# Patient Record
Sex: Female | Born: 1950 | Race: White | Hispanic: No | Marital: Married | Smoking: Never smoker
Health system: Southern US, Community
[De-identification: ages and names within clinical notes are randomized; demographics above are authoritative.]

## PROBLEM LIST (undated history)

## (undated) DIAGNOSIS — F32A Depression, unspecified: Secondary | ICD-10-CM

## (undated) DIAGNOSIS — G459 Transient cerebral ischemic attack, unspecified: Secondary | ICD-10-CM

## (undated) DIAGNOSIS — E669 Obesity, unspecified: Secondary | ICD-10-CM

## (undated) DIAGNOSIS — R7611 Nonspecific reaction to tuberculin skin test without active tuberculosis: Secondary | ICD-10-CM

## (undated) DIAGNOSIS — F329 Major depressive disorder, single episode, unspecified: Secondary | ICD-10-CM

## (undated) HISTORY — PX: OTHER SURGICAL HISTORY: SHX169

## (undated) HISTORY — DX: Nonspecific reaction to tuberculin skin test without active tuberculosis: R76.11

## (undated) HISTORY — DX: Major depressive disorder, single episode, unspecified: F32.9

## (undated) HISTORY — DX: Obesity, unspecified: E66.9

## (undated) HISTORY — DX: Depression, unspecified: F32.A

## (undated) HISTORY — DX: Transient cerebral ischemic attack, unspecified: G45.9

---

## 2009-02-02 ENCOUNTER — Ambulatory Visit: Payer: Self-pay | Admitting: Family Medicine

## 2009-02-02 DIAGNOSIS — G459 Transient cerebral ischemic attack, unspecified: Secondary | ICD-10-CM | POA: Insufficient documentation

## 2009-02-02 DIAGNOSIS — E039 Hypothyroidism, unspecified: Secondary | ICD-10-CM | POA: Insufficient documentation

## 2009-02-02 DIAGNOSIS — I10 Essential (primary) hypertension: Secondary | ICD-10-CM | POA: Insufficient documentation

## 2009-02-27 DIAGNOSIS — M7120 Synovial cyst of popliteal space [Baker], unspecified knee: Secondary | ICD-10-CM

## 2009-02-27 DIAGNOSIS — K3189 Other diseases of stomach and duodenum: Secondary | ICD-10-CM

## 2009-02-27 DIAGNOSIS — R1013 Epigastric pain: Secondary | ICD-10-CM

## 2009-03-06 ENCOUNTER — Encounter: Payer: Self-pay | Admitting: Family Medicine

## 2009-03-06 LAB — CONVERTED CEMR LAB
ALT: 29 units/L (ref 0–35)
AST: 19 units/L (ref 0–37)
Alkaline Phosphatase: 118 units/L — ABNORMAL HIGH (ref 39–117)
Calcium: 9.5 mg/dL (ref 8.4–10.5)
Creatinine, Ser: 0.63 mg/dL (ref 0.40–1.20)
Sodium: 139 meq/L (ref 135–145)

## 2009-03-12 ENCOUNTER — Ambulatory Visit: Payer: Self-pay | Admitting: Family Medicine

## 2009-03-12 DIAGNOSIS — D239 Other benign neoplasm of skin, unspecified: Secondary | ICD-10-CM | POA: Insufficient documentation

## 2009-03-13 ENCOUNTER — Encounter: Payer: Self-pay | Admitting: Family Medicine

## 2009-03-13 LAB — CONVERTED CEMR LAB
HDL: 33 mg/dL — ABNORMAL LOW (ref >39)
LDL Cholesterol: 166 mg/dL — ABNORMAL HIGH (ref 0–99)
Total CHOL/HDL Ratio: 7.5
VLDL: 47 mg/dL — ABNORMAL HIGH (ref 0–40)

## 2009-05-14 ENCOUNTER — Ambulatory Visit: Payer: Self-pay | Admitting: Family Medicine

## 2009-05-14 DIAGNOSIS — T7840XA Allergy, unspecified, initial encounter: Secondary | ICD-10-CM | POA: Insufficient documentation

## 2009-05-14 DIAGNOSIS — E1165 Type 2 diabetes mellitus with hyperglycemia: Secondary | ICD-10-CM

## 2009-05-14 LAB — CONVERTED CEMR LAB
Blood Glucose, Fasting: 227 mg/dL
Hgb A1c MFr Bld: 10 %

## 2009-05-17 ENCOUNTER — Telehealth: Payer: Self-pay | Admitting: Family Medicine

## 2009-06-14 ENCOUNTER — Ambulatory Visit: Payer: Self-pay | Admitting: Family Medicine

## 2009-07-09 ENCOUNTER — Ambulatory Visit: Payer: Self-pay | Admitting: Family Medicine

## 2009-07-09 LAB — CONVERTED CEMR LAB

## 2009-08-27 ENCOUNTER — Ambulatory Visit: Payer: Self-pay | Admitting: Family Medicine

## 2009-08-27 LAB — CONVERTED CEMR LAB: Hgb A1c MFr Bld: 7.2 %

## 2009-08-27 LAB — HM DIABETES FOOT EXAM

## 2009-11-26 ENCOUNTER — Ambulatory Visit: Payer: Self-pay | Admitting: Family Medicine

## 2010-01-17 ENCOUNTER — Ambulatory Visit: Payer: Self-pay | Admitting: Family Medicine

## 2010-01-17 DIAGNOSIS — R209 Unspecified disturbances of skin sensation: Secondary | ICD-10-CM | POA: Insufficient documentation

## 2010-01-17 LAB — CONVERTED CEMR LAB: Hgb A1c MFr Bld: 7.3 %

## 2010-01-18 ENCOUNTER — Encounter: Admission: RE | Admit: 2010-01-18 | Discharge: 2010-01-18 | Payer: Self-pay | Admitting: Family Medicine

## 2010-01-21 ENCOUNTER — Telehealth: Payer: Self-pay | Admitting: Family Medicine

## 2010-04-09 ENCOUNTER — Ambulatory Visit: Payer: Self-pay | Admitting: Family Medicine

## 2010-04-23 ENCOUNTER — Encounter: Payer: Self-pay | Admitting: Family Medicine

## 2010-07-22 ENCOUNTER — Telehealth: Payer: Self-pay | Admitting: Family Medicine

## 2010-08-20 ENCOUNTER — Encounter: Payer: Self-pay | Admitting: Family Medicine

## 2010-08-20 LAB — HM DIABETES EYE EXAM: HM Diabetic Eye Exam: NORMAL

## 2010-08-26 ENCOUNTER — Encounter: Payer: Self-pay | Admitting: Family Medicine

## 2010-09-05 ENCOUNTER — Ambulatory Visit: Payer: Self-pay | Admitting: Family Medicine

## 2010-09-05 DIAGNOSIS — M545 Low back pain: Secondary | ICD-10-CM

## 2010-09-05 LAB — CONVERTED CEMR LAB

## 2010-10-07 ENCOUNTER — Telehealth: Payer: Self-pay | Admitting: Family Medicine

## 2010-12-24 NOTE — Assessment & Plan Note (Signed)
Summary: FU DM, right arm numbness   Vital Signs:  Patient profile:   60 year old female Weight:      216.04 pounds Temp:     96.7 degrees F oral Pulse rate:   74 / minute BP sitting:   123 / 86 Cuff size:   regular  Vitals Entered By: Kern Reap CMA Duncan Dull) (January 17, 2010 3:57 PM) CC: follow-up visit Is Patient Diabetic? Yes Did you bring your meter with you today? No   Primary Care Provider:  Nani Gasser MD  CC:  follow-up visit.  History of Present Illness: Right arm stays numb.  Sometimes if moves her neck feels like a shooting sensation down her arm.  Numbness is down to her fingertips.  No neck pain.  Wonder if it her matress. Worse if bends forward.   Discomfort is constant but does vary in intensity. Feels it originates in the Upper trap area and literaly goes to her finger tips. Feels if rubs the muscles over the upper trap that is provides some relief.  No weakness of the arm. No neck or shoulder trauma.  Has been using motrin as needed - helps some.    Diabetes Management History:      The patient is a 60 years old female who comes in for evaluation of Type 2 Diabetes Mellitus.  She states understanding of dietary principles and is following her diet appropriately.  She is checking home blood sugars.  She says that she is not exercising regularly.        Hypoglycemic symptoms are not occurring.  No hyperglycemic symptoms are reported.  Other comments include: Has been running over 200. Running 172 this Am. .        There are no symptoms to suggest diabetic complications.  Since her last visit, no infections have occurred.  No changes have been made to her treatment plan since last visit.    Allergies: 1)  ! Ampicillin 2)  ! Pcn 3)  ! Nitroglycerin 4)  ! Fluvirin (Influenza Vac Typ A&b Surf Ant)  Past History:  Past Surgical History: Right carpel tunnel surgery.   Social History: Reviewed history from 08/27/2009 and no changes required. 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  Married to [employer].  Never Smoked Alcohol use-no Drug use-no Regular exercise-no  Physical Exam  General:  Well-developed,well-nourished,in no acute distress; alert,appropriate and cooperative throughout examination Lungs:  Normal respiratory effort, chest expands symmetrically. Lungs are clear to auscultation, no crackles or wheezes. Heart:  Normal rate and regular rhythm. S1 and S2 normal without gallop, murmur, click, rub or other extra sounds. Msk:  Neck with NROM but has pain with side bending to the right greater than the left.  Pain with rotation right as well. Shoulders wtih NROM  SHoulder, elbow, and wrist strength is 5/5.  All fingers have 5/5 strength in the right hand.   Skin:  WEll healed surgical scar on her right wrist.   Psych:  Cognition and judgment appear intact. Alert and cooperative with normal attention span and concentration. No apparent delusions, illusions, hallucinations   Impression & Recommendations:  Problem # 1:  DIABETES MELLITUS, UNCONTROLLED (ICD-250.02) Assessment Deteriorated Reminded him to get her eye exam.  Her A1C is above goal.Will stop her gliucotrol and change to onglyza once a day.  Samples given for one week adn coupon card given.  Rx sent ot pharm. F/u in 6 weeks. Asked her to bring in her sugar log.  Her updated medication list for this problem includes:    Lisinopril-hydrochlorothiazide 20-25 Mg Tabs (Lisinopril-hydrochlorothiazide) .Marland Kitchen... Take 1 tablet by mouth once a day    Metformin Hcl 1000 Mg Tabs (Metformin hcl) .Marland Kitchen... Take 1 tablet by mouth two times a day    Onglyza 5 Mg Tabs (Saxagliptin hcl) .Marland Kitchen... Take 1 tablet by mouth once a day in the am.  Orders: Fingerstick (36416) Hgb A1C (16109UE)  Problem # 2:  ARM NUMBNESS (ICD-782.0) Assessment: New Discussed  that I think this may be coming from her neck. Possible a disck issue. It sounds like a nerve is being impinged at some point from  her neck down.  WIll get an xray of her neck and in the meantime will have her try a muscle relaxer since she also has some pain and tenderness over the upper trapezius on the right and see if this provides any relief.  I am concnered that her sxs persist throughout the day.   Orders: T-DG Cervical Spine 2-3 Views (45409)  Complete Medication List: 1)  Levothroid 75 Mcg Tabs (Levothyroxine sodium) .... Take 1 tablet by mouth once a day 2)  Lisinopril-hydrochlorothiazide 20-25 Mg Tabs (Lisinopril-hydrochlorothiazide) .... Take 1 tablet by mouth once a day 3)  Simvastatin 40 Mg Tabs (Simvastatin) .... Take 1 tablet by mouth once a day at bedtime 4)  Glucometer  .... Strips and lancets for new dx dm 250.02. to check up to 2 x a day 5)  Metformin Hcl 1000 Mg Tabs (Metformin hcl) .... Take 1 tablet by mouth two times a day 6)  Toprol Xl 25 Mg Xr24h-tab (Metoprolol succinate) .... Take 1 tablet by mouth once a day 7)  Onglyza 5 Mg Tabs (Saxagliptin hcl) .... Take 1 tablet by mouth once a day in the am. 8)  Flexeril 10 Mg Tabs (Cyclobenzaprine hcl) .... Take 1 tablet by mouth once a day at bedtime  as needed muscle spasm  Patient Instructions: 1)  Stop the glucotrol and start the onlyza. It is once a day.  2)  Please schedule a follow-up appointment in 6 weeks with your blood sugar.  Prescriptions: FLEXERIL 10 MG TABS (CYCLOBENZAPRINE HCL) Take 1 tablet by mouth once a day at bedtime  as needed muscle spasm  #20 x 0   Entered and Authorized by:   Nani Gasser MD   Signed by:   Nani Gasser MD on 01/17/2010   Method used:   Electronically to        [facility] [phone]* (retail)       [address] [place], [place]  [address]       Ph: [phone]       Fax: [phone]   RxID:   [id number] ONGLYZA 5 MG TABS (SAXAGLIPTIN HCL) Take 1 tablet by mouth once a day in the AM.  #30 x 2   Entered and Authorized by:   Nani Gasser MD   Signed by:   Nani Gasser MD on  01/17/2010   Method used:   Electronically to        [facility] [phone]* (retail)       [address] [place], [place]  [address]       Ph: [phone]       Fax: [phone]   RxID:   [id number]   Laboratory Results   Blood Tests     HGBA1C: 7.3%   (  Normal Range: Non-Diabetic - 3-6%   Control Diabetic - 6-8%)

## 2010-12-24 NOTE — Progress Notes (Signed)
Summary: refill-- flexpen  Phone Note Refill Request Message from:  Patient on October 07, 2010 5:24 PM  Refills Requested: Medication #1:  Levemir flex pen  10 units at bedtime. Pt states they received sample from [place]. She takes 10 units at bedtime. Needs refill sent to pharmacy.  Please advise.   Next Appointment Scheduled: 12-09-10 Dr Linford Arnold Initial call taken by: Mervin Kung CMA Duncan Dull),  October 07, 2010 5:26 PM  Follow-up for Phone Call        Tell her she can pick up a sample since doesn't have insruance. Double check that it is not lantus instead of Levemir.   Follow-up by: Nani Gasser MD,  October 08, 2010 7:49 AM  Additional Follow-up for Phone Call Additional follow up Details #1::        2 boxes of samples put up for pt to come and pick up Additional Follow-up by: Kathlene November LPN,  October 08, 2010 2:26 PM

## 2010-12-24 NOTE — Assessment & Plan Note (Addendum)
Summary: 4 mo. f/u  DM   Vital Signs:  Patient profile:   60 year old female Height:      67 inches Weight:      214 pounds Pulse rate:   81 / minute BP sitting:   123 / 87  (left arm) Cuff size:   regular  Vitals Entered By: Avon Gully CMA, Duncan Dull) (September 05, 2010 3:43 PM) CC: f/u diabetes   Primary Care Provider:  Nani Gasser MD  CC:  f/u diabetes.  History of Present Illness: Low back pain on both sides of her back for sevearl months. Feels stiff when she stands up. Worse when she bends over. Occ take OTC pain reliver.  No trauma.  Has had problems with her back n this area before adn says took 4 diffferent meds for it.   Diabetes Management History:      The patient is a 60 years old female who comes in for evaluation of Type 2 Diabetes Mellitus.  She states understanding of dietary principles and is following her diet appropriately.  Self foot exams are being performed.  She is checking home blood sugars.  She says that she is not exercising regularly.        Hypoglycemic symptoms are not occurring.  No hyperglycemic symptoms are reported.  Other comments include: Sugars 140-200.        There are no symptoms to suggest diabetic complications.  Since her last visit, no infections have occurred.  No changes have been made to her treatment plan since last visit.    Habits & Providers  Exercise-Depression-Behavior     Times/week: 5  Current Medications (verified): 1)  Levothroid 75 Mcg Tabs (Levothyroxine Sodium) .... Take 1 Tablet By Mouth Once A Day 2)  Lisinopril-Hydrochlorothiazide 20-25 Mg Tabs (Lisinopril-Hydrochlorothiazide) .... Take 1 Tablet By Mouth Once A Day 3)  Simvastatin 40 Mg Tabs (Simvastatin) .... Take 1 Tablet By Mouth Once A Day At Bedtime 4)  Glucometer .... Strips and Lancets For New Dx Dm 250.02. To Check Up To 2 X A Day 5)  Metformin Hcl 1000 Mg Tabs (Metformin Hcl) .... Take 1 Tablet By Mouth Two Times A Day 6)  Toprol Xl 25 Mg Xr24h-Tab  (Metoprolol Succinate) .... Take 1 Tablet By Mouth Once A Day 7)  Glipizide 10 Mg Xr24h-Tab (Glipizide) .... Take 1 Tablet By Mouth Once A Day 8)  Aspirin 81 Mg Tbec (Aspirin) 9)  Prilosec Otc 20 Mg Tbec (Omeprazole Magnesium) .... Take 1 Tab By Mouth Once Daily  Allergies (verified): 1)  ! Ampicillin 2)  ! Pcn 3)  ! Nitroglycerin 4)  ! Fluvirin (Influenza Vac Typ A&b Surf Ant)  Comments:  Nurse/Medical Assistant: The patient's medications and allergies were reviewed with the patient and were updated in the Medication and Allergy Lists. Avon Gully CMA, Duncan Dull) (September 05, 2010 3:44 PM)  Physical Exam  General:  Well-developed,well-nourished,in no acute distress; alert,appropriate and cooperative throughout examination Msk:  NOrmal flexion, dec extension. Normal rotation right and left and side bending. Neg straigh let raise. Hip, knee, and ankle strength 5/5 bilat.  Neurologic:  Patellar 2+ bilat.    Impression & Recommendations:  Problem # 1:  DIABETES MELLITUS, UNCONTROLLED (ICD-250.02) Not at goal. Discused the need for weight loss. Also given samples of Lantus to start using 10 units at bedtime .  She doesn' have insurance and will not be able to afford this but can provide samples until her f/u and maybe by then can  work on losing some weight.  Micro is + but she is on an ACE.  Labs Reviewed: Creat: 0.63 (03/06/2009)   Microalbumin: 80 (09/05/2010)  Last Eye Exam: normal (08/20/2010) Reviewed HgBA1c results: 7.8 (09/05/2010)  7.3 (01/17/2010)  Problem # 2:  BACK PAIN, LUMBAR (ICD-724.2)  Her updated medication list for this problem includes:    Aspirin 81 Mg Tbec (Aspirin)    Mobic 7.5 Mg Tabs (Meloxicam) .Marland Kitchen... Take 1 tablet by mouth two times a day as needed for back pain.  Discussed use of moist heat or ice, modified activities, medications, and stretching/strengthening exercises. Back care instructions given. To be seen in 2 weeks if no improvement; sooner if  worsening of symptoms.   Complete Medication List: 1)  Levothroid 75 Mcg Tabs (Levothyroxine sodium) .... Take 1 tablet by mouth once a day 2)  Lisinopril-hydrochlorothiazide 20-25 Mg Tabs (Lisinopril-hydrochlorothiazide) .... Take 1 tablet by mouth once a day 3)  Simvastatin 40 Mg Tabs (Simvastatin) .... Take 1 tablet by mouth once a day at bedtime 4)  Glucometer  .... Strips and lancets for new dx dm 250.02. to check up to 2 x a day 5)  Metformin Hcl 1000 Mg Tabs (Metformin hcl) .... Take 1 tablet by mouth two times a day 6)  Toprol Xl 25 Mg Xr24h-tab (Metoprolol succinate) .... Take 1 tablet by mouth once a day 7)  Glipizide 10 Mg Xr24h-tab (Glipizide) .... Take 1 tablet by mouth once a day 8)  Aspirin 81 Mg Tbec (Aspirin) 9)  Prilosec Otc 20 Mg Tbec (Omeprazole magnesium) .... Take 1 tab by mouth once daily 10)  Mobic 7.5 Mg Tabs (Meloxicam) .... Take 1 tablet by mouth two times a day as needed for back pain.  Other Orders: Fingerstick (36416) Hemoglobin A1C (83036) Creatinine  (04540) Urine Microalbumin (98119)  Patient Instructions: 1)  REally needs to work on your weight.  Work on [employer]. If you can lose about 10 lbs it will really help you sugars.  2)  Given sample of Lantus to given 10 units at beditme.  3)  Please schedule a follow-up appointment in 3 months .  Prescriptions: MOBIC 7.5 MG TABS (MELOXICAM) Take 1 tablet by mouth two times a day as needed for back pain.  #60 x 0   Entered and Authorized by:   Nani Gasser MD   Signed by:   Nani Gasser MD on 09/05/2010   Method used:   Electronically to        [facility] [phone]* (retail)       [address] [place], [place]  [address]       Ph: [phone]       Fax: [phone]   RxID:   [id number]   Laboratory Results   Urine Tests    Microalbumin (urine): 80 mg/L Creatinine: 300mg /dL  A:C Ratio 34-$[id number]$ /g  Blood Tests   Date/Time Received: 09/05/10 Date/Time Reported:  09/05/10  HGBA1C: 7.8%   (Normal Range: Non-Diabetic - 3-6%   Control Diabetic - 6-8%)        Contraindications/Deferment of Procedures/Staging:    Treatment: Flu Shot    Contraindication: allergy

## 2010-12-24 NOTE — Assessment & Plan Note (Signed)
Summary: URI   Vital Signs:  Patient profile:   60 year old female Height:      67 inches Weight:      214 pounds O2 Sat:      95 % on Room air Temp:     98.7 degrees F oral BP sitting:   115 / 84  (left arm) Cuff size:   regular  Vitals Entered By: Kathlene November (November 26, 2009 1:27 PM)  O2 Flow:  Room air CC: H/A, chills, cough, nausea, back and shoulder pain. Started last Thursday   Primary Care Provider:  Nani Gasser MD  CC:  H/A, chills, cough, nausea, and back and shoulder pain. Started last Thursday.  History of Present Illness: H/A, chills, cough, nausea, back and shoulder pain. Started last Thursday ( 5 days). Severe cougha and now her ribs are sore.  No fever. Taking Advil, Theraflu - not really helping.  Mild nasal congestions.   Current Medications (verified): 1)  Levothroid 75 Mcg Tabs (Levothyroxine Sodium) .... Take 1 Tablet By Mouth Once A Day 2)  Lisinopril-Hydrochlorothiazide 20-25 Mg Tabs (Lisinopril-Hydrochlorothiazide) .... Take 1 Tablet By Mouth Once A Day 3)  Simvastatin 40 Mg Tabs (Simvastatin) .... Take 1 Tablet By Mouth Once A Day At Bedtime 4)  Glucometer .... Strips and Lancets For New Dx Dm 250.02. To Check Up To 2 X A Day 5)  Metformin Hcl 1000 Mg Tabs (Metformin Hcl) .... Take 1 Tablet By Mouth Two Times A Day 6)  Toprol Xl 25 Mg Xr24h-Tab (Metoprolol Succinate) .... Take 1 Tablet By Mouth Once A Day 7)  Glucotrol Xl 10 Mg Xr24h-Tab (Glipizide) .... Take 1 Tablet By Mouth Once A Day  Allergies (verified): 1)  ! Ampicillin 2)  ! Pcn 3)  ! Nitroglycerin 4)  ! Fluvirin (Influenza Vac Typ A&b Surf Ant)  Comments:  Nurse/Medical Assistant: The patient's medications and allergies were reviewed with the patient and were updated in the Medication and Allergy Lists. Kathlene November (November 26, 2009 1:28 PM)  Physical Exam  General:  Well-developed,well-nourished,in no acute distress; alert,appropriate and cooperative throughout  examination Head:  Normocephalic and atraumatic without obvious abnormalities. No apparent alopecia or balding. Eyes:  No corneal or conjunctival inflammation noted. EOMI. Perrla.  Ears:  External ear exam shows no significant lesions or deformities.  Otoscopic examination reveals clear canals, tympanic membranes are intact bilaterally without bulging, retraction, inflammation or discharge. Hearing is grossly normal bilaterally. Nose:  External nasal examination shows no deformity or inflammation. Nasal mucosa are pink and moist without lesions or exudates. Mouth:  Oral mucosa and oropharynx without lesions or exudates.  Dentures.  Neck:  No deformities, masses, or tenderness noted. Lungs:  Normal respiratory effort, chest expands symmetrically. Lungs are clear to auscultation, no crackles or wheezes. Heart:  Normal rate and regular rhythm. S1 and S2 normal without gallop, murmur, click, rub or other extra sounds. Pulses:  Radial 2+  Skin:  no rashes.   Cervical Nodes:  No lymphadenopathy noted Psych:  Cognition and judgment appear intact. Alert and cooperative with normal attention span and concentration. No apparent delusions, illusions, hallucinations   Impression & Recommendations:  Problem # 1:  URI (ICD-465.9) If not better in 4-5 days then please fill rx for the ABX. Given phenergan for nausea. Will cover for sinusitis since lung exam is clear.  Her updated medication list for this problem includes:    Promethazine Hcl 25 Mg Tabs (Promethazine hcl) ..... One by mouth every  6 hours as needed nausea.  Complete Medication List: 1)  Levothroid 75 Mcg Tabs (Levothyroxine sodium) .... Take 1 tablet by mouth once a day 2)  Lisinopril-hydrochlorothiazide 20-25 Mg Tabs (Lisinopril-hydrochlorothiazide) .... Take 1 tablet by mouth once a day 3)  Simvastatin 40 Mg Tabs (Simvastatin) .... Take 1 tablet by mouth once a day at bedtime 4)  Glucometer  .... Strips and lancets for new dx dm 250.02. to  check up to 2 x a day 5)  Metformin Hcl 1000 Mg Tabs (Metformin hcl) .... Take 1 tablet by mouth two times a day 6)  Toprol Xl 25 Mg Xr24h-tab (Metoprolol succinate) .... Take 1 tablet by mouth once a day 7)  Glucotrol Xl 10 Mg Xr24h-tab (Glipizide) .... Take 1 tablet by mouth once a day 8)  Promethazine Hcl 25 Mg Tabs (Promethazine hcl) .... One by mouth every 6 hours as needed nausea. 9)  Sulfamethoxazole-trimethoprim 400-80 Mg/25ml Soln (Sulfamethoxazole-trimethoprim) .Marland Kitchen.. 10 ml by mouth two times a day for 10 days. Prescriptions: SULFAMETHOXAZOLE-TRIMETHOPRIM 400-80 MG/5ML SOLN (SULFAMETHOXAZOLE-TRIMETHOPRIM) 10 ml by mouth two times a day for 10 days.  #248ml x 0   Entered and Authorized by:   Nani Gasser MD   Signed by:   Nani Gasser MD on 11/26/2009   Method used:   Print then Give to Patient   RxID:   [id number] PROMETHAZINE HCL 25 MG TABS (PROMETHAZINE HCL) one by mouth every 6 hours as needed nausea.  #8 x 0   Entered and Authorized by:   Nani Gasser MD   Signed by:   Nani Gasser MD on 11/26/2009   Method used:   Electronically to        [facility] [phone]* (retail)       [address] [place], [place]  [address]       Ph: [phone]       Fax: [phone]   RxID:   [id number]

## 2010-12-24 NOTE — Assessment & Plan Note (Signed)
Summary: f/u DM/ thyroid   Vital Signs:  Patient profile:   60 year old female Height:      67 inches Weight:      215 pounds BMI:     33.80 O2 Sat:      98 % on Room air Pulse rate:   77 / minute BP sitting:   118 / 82  (left arm) Cuff size:   regular  Vitals Entered By: Payton Spark CMA (Apr 09, 2010 3:31 PM)  O2 Flow:  Room air CC: 6 week f/u   Primary Care Provider:  Nani Gasser MD  CC:  6 week f/u.  History of Present Illness: 60 yo WF presents for f/u visit.  She is a little early for her diabetes check up.  She admits to dieatary non adherence.  Her AM fastings are in the 150-170s.  She is also running 170s to 180s at bedtime.  She is takiing Metformin + Onglyza.  She is not exercising.  Denies lows.  Has hand tingling- seeing ortho.  Denies tingling in feet.  Denies blurry vision.  Fasting labs are due.      Current Medications (verified): 1)  Levothroid 75 Mcg Tabs (Levothyroxine Sodium) .... Take 1 Tablet By Mouth Once A Day 2)  Lisinopril-Hydrochlorothiazide 20-25 Mg Tabs (Lisinopril-Hydrochlorothiazide) .... Take 1 Tablet By Mouth Once A Day 3)  Simvastatin 40 Mg Tabs (Simvastatin) .... Take 1 Tablet By Mouth Once A Day At Bedtime 4)  Glucometer .... Strips and Lancets For New Dx Dm 250.02. To Check Up To 2 X A Day 5)  Metformin Hcl 1000 Mg Tabs (Metformin Hcl) .... Take 1 Tablet By Mouth Two Times A Day 6)  Toprol Xl 25 Mg Xr24h-Tab (Metoprolol Succinate) .... Take 1 Tablet By Mouth Once A Day 7)  Onglyza 5 Mg Tabs (Saxagliptin Hcl) .... Take 1 Tablet By Mouth Once A Day in The Am. 8)  Aspirin 81 Mg Tbec (Aspirin) 9)  Prilosec Otc 20 Mg Tbec (Omeprazole Magnesium) .... Take 1 Tab By Mouth Once Daily  Allergies (verified): 1)  ! Ampicillin 2)  ! Pcn 3)  ! Nitroglycerin 4)  ! Fluvirin (Influenza Vac Typ A&b Surf Ant)  Past History:  Past Medical History: Hx of TIA  Hx of depression.  Has been on Prozac, zoloft,  in the past.  Hx of + PPD, Neg  CXR10/2/06 T2DM obesity  Social History: Reviewed history from 08/27/2009 and no changes required. 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  Married to [employer].  Never Smoked Alcohol use-no Drug use-no Regular exercise-no  Review of Systems      See HPI  Physical Exam  General:  obese WF in NAD   Head:  /AT Eyes:  PERRLA Mouth:  pharynx pink and moist.   Neck:  no masses.   Lungs:  Normal respiratory effort, chest expands symmetrically. Lungs are clear to auscultation, no crackles or wheezes. Heart:  Normal rate and regular rhythm. S1 and S2 normal without gallop, murmur, click, rub or other extra sounds. Skin:  color normal.   Cervical Nodes:  No lymphadenopathy noted Psych:  good eye contact, not anxious appearing, and not depressed appearing.     Impression & Recommendations:  Problem # 1:  DIABETES MELLITUS, UNCONTROLLED (ICD-250.02) A1C order placed for after the 24th.  Home sugars not at goal mostly due to dietary non adherence and lack of exercise.  H/O given today on Her updated medication list for this problem  includes:    Lisinopril-hydrochlorothiazide 20-25 Mg Tabs (Lisinopril-hydrochlorothiazide) .Marland Kitchen... Take 1 tablet by mouth once a day    Metformin Hcl 1000 Mg Tabs (Metformin hcl) .Marland Kitchen... Take 1 tablet by mouth two times a day    Onglyza 5 Mg Tabs (Saxagliptin hcl) .Marland Kitchen... Take 1 tablet by mouth once a day in the am.    Aspirin 81 Mg Tbec (Aspirin)  Orders: T-Comprehensive Metabolic Panel [phone]) T-Hgb A1C [phone]) T-Lipid Profile [phone])  Labs Reviewed: Creat: 0.63 (03/06/2009)   Microalbumin: 80 (07/09/2009)  Last Eye Exam: normal (11/24/2008) Reviewed HgBA1c results: 7.3 (01/17/2010)  7.2 (08/27/2009)  Problem # 2:  HYPOTHYROIDISM (ICD-244.9) Update TSH with labs and f/u results.  Adjust thyroid medicine if indicated.   Her updated medication list for this problem includes:    Levothroid 75 Mcg Tabs (Levothyroxine sodium)  .Marland Kitchen... Take 1 tablet by mouth once a day  Orders: T-TSH ([id number])  Labs Reviewed: TSH: 3.090 (03/06/2009)    HgBA1c: 7.3 (01/17/2010) Chol: 246 (03/13/2009)   HDL: 33 (03/13/2009)   LDL: 166 (03/13/2009)   TG: 236 (03/13/2009)  Problem # 3:  HYPERTENSION, BENIGN (ICD-401.1) BP looks great.  Stay on current meds.   Her updated medication list for this problem includes:    Lisinopril-hydrochlorothiazide 20-25 Mg Tabs (Lisinopril-hydrochlorothiazide) .Marland Kitchen... Take 1 tablet by mouth once a day    Toprol Xl 25 Mg Xr24h-tab (Metoprolol succinate) .Marland Kitchen... Take 1 tablet by mouth once a day  BP today: 118/82 Prior BP: 123/86 (01/17/2010)  Prior 10 Yr Risk Heart Disease: > 32 % (06/14/2009)  Labs Reviewed: K+: 3.7 (03/06/2009) Creat: : 0.63 (03/06/2009)   Chol: 246 (03/13/2009)   HDL: 33 (03/13/2009)   LDL: 166 (03/13/2009)   TG: 236 (03/13/2009)  Complete Medication List: 1)  Levothroid 75 Mcg Tabs (Levothyroxine sodium) .... Take 1 tablet by mouth once a day 2)  Lisinopril-hydrochlorothiazide 20-25 Mg Tabs (Lisinopril-hydrochlorothiazide) .... Take 1 tablet by mouth once a day 3)  Simvastatin 40 Mg Tabs (Simvastatin) .... Take 1 tablet by mouth once a day at bedtime 4)  Glucometer  .... Strips and lancets for new dx dm 250.02. to check up to 2 x a day 5)  Metformin Hcl 1000 Mg Tabs (Metformin hcl) .... Take 1 tablet by mouth two times a day 6)  Toprol Xl 25 Mg Xr24h-tab (Metoprolol succinate) .... Take 1 tablet by mouth once a day 7)  Onglyza 5 Mg Tabs (Saxagliptin hcl) .... Take 1 tablet by mouth once a day in the am. 8)  Aspirin 81 Mg Tbec (Aspirin) 9)  Prilosec Otc 20 Mg Tbec (Omeprazole magnesium) .... Take 1 tab by mouth once daily  Patient Instructions: 1)  Update fasting labs after 5-24.  Will call you w/ results. 2)  Stay on current meds. 3)  Work on healthy, low carb diet, regular exercise and wt loss. 4)  Return for f/u in 4 mos. Prescriptions: METFORMIN HCL 1000 MG TABS  (METFORMIN HCL) Take 1 tablet by mouth two times a day  #60 Each x 3   Entered and Authorized by:   Seymour Bars DO   Signed by:   Seymour Bars DO on 04/09/2010   Method used:   Electronically to        [facility] [phone]* (retail)       [address] [place], [place]  [address]       Ph: [phone]       Fax: [phone]  RxID:   [id number]

## 2010-12-24 NOTE — Miscellaneous (Signed)
Summary: Diabetic Eye exam  Clinical Lists Changes  Observations: Added new observation of EYES COMMENT: 08/2011 (08/26/2010 17:29) Added new observation of EYE EXAM BY: [facility] (08/20/2010 17:30) Added new observation of DMEYEEXMRES: normal (08/20/2010 17:30) Added new observation of DIAB EYE EX: normal (08/20/2010 17:30)      Diabetes Management History:      The patient is a 60 years old female who comes in for evaluation of Type 2 Diabetes Mellitus.  She is checking home blood sugars.  She says that she is not exercising regularly.    Diabetes Management Exam:    Eye Exam:       Eye Exam done elsewhere          Date: 08/20/2010          Results: normal          Done by: [facility]

## 2010-12-24 NOTE — Progress Notes (Signed)
Summary: Onglyza too expensive.   Phone Note Other Incoming   Caller: pharmacy Summary of Call: Pt can't afford onglyza. Has no insurance. Can change back to Glipizide but if sugars running > 150 fasting then ill need insulin.  Initial call taken by: Nani Gasser MD,  July 22, 2010 12:03 PM    New/Updated Medications: GLIPIZIDE 10 MG XR24H-TAB (GLIPIZIDE) Take 1 tablet by mouth once a day Prescriptions: GLIPIZIDE 10 MG XR24H-TAB (GLIPIZIDE) Take 1 tablet by mouth once a day  #30 x 1   Entered and Authorized by:   Nani Gasser MD   Signed by:   Nani Gasser MD on 07/22/2010   Method used:   Electronically to        [facility] [phone]* (retail)       [address] [place], [place]  [address]       Ph: [phone]       Fax: [phone]   RxID:   [id number]

## 2010-12-24 NOTE — Letter (Signed)
Summary: Carolin Sicks   Imported By: Lanelle Bal 09/06/2010 12:28:21  _____________________________________________________________________  External Attachment:    Type:   Image     Comment:   External Document

## 2010-12-24 NOTE — Progress Notes (Signed)
Summary: Referral  Phone Note Call from Patient Call back at Home Phone [phone]   Caller: Patient Call For: Nani Gasser MD Summary of Call: Need ortho referral put in to send to [facility]. Thanks Initial call taken by: Kathlene November,  January 21, 2010 8:06 AM

## 2011-01-20 ENCOUNTER — Ambulatory Visit: Payer: Self-pay | Admitting: Family Medicine

## 2011-02-07 ENCOUNTER — Encounter: Payer: Self-pay | Admitting: Family Medicine

## 2011-02-11 ENCOUNTER — Ambulatory Visit: Payer: Self-pay | Admitting: Family Medicine

## 2011-02-11 ENCOUNTER — Ambulatory Visit (INDEPENDENT_AMBULATORY_CARE_PROVIDER_SITE_OTHER): Payer: Self-pay | Admitting: Family Medicine

## 2011-02-11 DIAGNOSIS — E119 Type 2 diabetes mellitus without complications: Secondary | ICD-10-CM

## 2011-02-11 LAB — POCT GLYCOSYLATED HEMOGLOBIN (HGB A1C): Hemoglobin A1C: 8.1

## 2011-02-11 NOTE — Patient Instructions (Addendum)
Go onto the website for [email or web address] and print form to complete to see if qualify for assistance with your Lantus.     Diabetes, Type 2 Diabetes is a lasting (chronic) disease. In type 2 diabetes, the pancreas does not make enough insulin (a hormone), and the body does not respond normally to the insulin that is made. This type of diabetes was also previously called adult onset diabetes. About 90% of all those who have diabetes have type 2. It usually occurs after the age of 63 but can occur at any age. CAUSES Unlike type 1 diabetes, which happens because insulin is no longer being made, type 2 diabetes happens because the body is making less insulin and has trouble using the insulin properly. SYMPTOMS  Drinking more than usual.   Urinating more than usual.   Blurred vision.   Dry, itchy skin.   Frequent infection like yeast infections in women.   More tired than usual (fatigue).  TREATMENT  Healthy eating.   Exercise.   Medication, if needed.   Monitoring blood glucose (sugar).   Seeing your caregiver regularly.  HOME CARE INSTRUCTIONS  Check your blood glucose (sugar) at least once daily. More frequent monitoring may be necessary, depending on your medications and on how well your diabetes is controlled. Your caregiver will advise you.   Take your medicine as directed by your caregiver.   Do not smoke.   Make wise food choices. Ask your caregiver for information. Weight loss can improve your diabetes.   Learn about low blood glucose (hypoglycemia) and how to treat it.   Get your eyes checked regularly.   Have a yearly physical exam. Have your blood pressure checked. Get your blood and urine tested.   Wear a pendant or bracelet saying that you have diabetes.   Check your feet every night for sores. Let your caregiver know if you have sores that are not healing.  SEEK MEDICAL CARE IF:  You are having problems keeping your blood glucose at target range.   You  feel you might be having problems with your medicines.   You have symptoms of an illness that is not improving after 24 hours.   You have a sore or wound that is not healing.   You notice a change in vision or a new problem with your vision.   Go To www.diabetes.org  Document Released: 11/10/2005 Document Re-Released: 12/02/2009 [facility] Patient Information 2011 [place], [place].

## 2011-02-11 NOTE — Progress Notes (Signed)
  Subjective:    Patient ID: Gina Lynch, female    DOB: 09-11-1951, 60 y.o.   MRN: [id number]  Diabetes She presents for her follow-up diabetic visit. She has type 2 diabetes mellitus. Pertinent negatives for hypoglycemia include no confusion, dizziness or nervousness/anxiousness. Associated symptoms include polydipsia. Pertinent negatives for diabetes include no blurred vision, no polyuria and no visual change. There are no hypoglycemic complications. Symptoms are worsening. Diabetic complications include heart disease. Risk factors for coronary artery disease include diabetes mellitus, obesity, stress and sedentary lifestyle. She has not had a previous visit with a dietician. She participates in exercise three times a week. Her breakfast blood glucose range is generally >200 mg/dl. An ACE inhibitor/angiotensin II receptor blocker is being taken. Eye exam is current (08/26/2010).   Used the lantus samples and it seemed to help her sugars.    Review of Systems  Eyes: Negative for blurred vision.  Genitourinary: Negative for polyuria.  Neurological: Negative for dizziness.  Hematological: Positive for polydipsia.  Psychiatric/Behavioral: Negative for confusion. The patient is not nervous/anxious.        Objective:   Physical Exam  Constitutional: She appears well-nourished.  HENT:  Head: Normocephalic.  Eyes: Pupils are equal, round, and reactive to light.  Cardiovascular: Normal rate, regular rhythm and normal heart sounds.   Pulmonary/Chest: Effort normal and breath sounds normal.          Assessment & Plan:

## 2011-02-11 NOTE — Assessment & Plan Note (Addendum)
A1C not at goal. She really needs to be on insulin consistantly but doesn't have insurance.  Will give her more samples of Lantus but she really needs to try to apply through the company to see if qualifies for help with th emedications. Her eye exam done 08/2010.  Foot exam nl today. Discussed importance of exercise and diet.

## 2011-02-21 ENCOUNTER — Other Ambulatory Visit: Payer: Self-pay | Admitting: Family Medicine

## 2011-03-31 ENCOUNTER — Other Ambulatory Visit: Payer: Self-pay | Admitting: Family Medicine

## 2011-04-26 ENCOUNTER — Other Ambulatory Visit: Payer: Self-pay | Admitting: Family Medicine

## 2011-05-15 ENCOUNTER — Other Ambulatory Visit: Payer: Self-pay | Admitting: Family Medicine

## 2011-05-15 MED ORDER — MELOXICAM 7.5 MG PO TABS: 7.5000 mg | ORAL_TABLET | Freq: Two times a day (BID) | ORAL | Status: DC | PRN

## 2011-05-15 NOTE — Telephone Encounter (Signed)
Pts husband called in and pt needs a refill of her meloxicam 7.5 mg.  Last OV 02-11-11 .  Will give the patient #60/0 refills, and send electronically to her pharmacy.  Pts husband aware script will be sent. Jarvis Newcomer, LPN [facility]

## 2011-06-03 ENCOUNTER — Other Ambulatory Visit: Payer: Self-pay | Admitting: Family Medicine

## 2011-06-06 ENCOUNTER — Other Ambulatory Visit: Payer: Self-pay | Admitting: Family Medicine

## 2011-06-06 NOTE — Telephone Encounter (Signed)
Husband requesting refill for this pt for meloxican. Plan:  Reviewed and refill good til 06-14-11. Jarvis Newcomer, LPN [facility]

## 2011-07-06 ENCOUNTER — Other Ambulatory Visit: Payer: Self-pay | Admitting: Family Medicine

## 2011-07-09 ENCOUNTER — Encounter: Payer: Self-pay | Admitting: Family Medicine

## 2011-07-18 ENCOUNTER — Ambulatory Visit: Payer: Self-pay | Admitting: Family Medicine

## 2011-08-14 ENCOUNTER — Other Ambulatory Visit: Payer: Self-pay | Admitting: Family Medicine

## 2011-09-02 ENCOUNTER — Encounter: Payer: Self-pay | Admitting: Family Medicine

## 2011-09-02 ENCOUNTER — Ambulatory Visit (INDEPENDENT_AMBULATORY_CARE_PROVIDER_SITE_OTHER): Payer: Self-pay | Admitting: Family Medicine

## 2011-09-02 DIAGNOSIS — E119 Type 2 diabetes mellitus without complications: Secondary | ICD-10-CM

## 2011-09-02 LAB — POCT GLYCOSYLATED HEMOGLOBIN (HGB A1C): Hemoglobin A1C: 8.6

## 2011-09-02 LAB — POCT UA - MICROALBUMIN

## 2011-09-02 NOTE — Progress Notes (Signed)
  Subjective:    Patient ID: Gina Lynch, female    DOB: 1951/09/17, 60 y.o.   MRN: [id number]  Diabetes She presents for her follow-up diabetic visit. She has type 2 diabetes mellitus. Her disease course has been improving. There are no hypoglycemic associated symptoms. Pertinent negatives for diabetes include no chest pain, no polydipsia, no polyphagia, no polyuria and no weight loss. There are no hypoglycemic complications. Symptoms are worsening. There are no diabetic complications. She is compliant with treatment all of the time. Her weight is stable. She has not had a previous visit with a dietician. She participates in exercise daily. Her home blood glucose trend is fluctuating minimally. Her breakfast blood glucose range is generally 180-200 mg/dl. An ACE inhibitor/angiotensin II receptor blocker is being taken. Eye exam is current.      Review of Systems  Constitutional: Negative for weight loss.  Cardiovascular: Negative for chest pain.  Genitourinary: Negative for polyuria.  Hematological: Negative for polydipsia and polyphagia.       Objective:   Physical Exam  Constitutional: She is oriented to person, place, and time. She appears well-developed and well-nourished.  HENT:  Head: Normocephalic and atraumatic.  Eyes: Conjunctivae are normal. Pupils are equal, round, and reactive to light.  Cardiovascular: Normal rate, regular rhythm and normal heart sounds.   Pulmonary/Chest: Effort normal and breath sounds normal.  Neurological: She is alert and oriented to person, place, and time.  Skin: Skin is warm and dry.  Psychiatric: She has a normal mood and affect. Her behavior is normal.          Assessment & Plan:  DM- not well controlled. She cannot afford insulin. Given samples of levemir. Restart 10 units a day. I also gave her some sample needles. Rec she call the 1-800 number to see if qualifies for assit. Her albumin is negative today. Recommend followup in 3-4  months. I essentially she is really struggling. She is walking daily after she gets off work and I encouraged her to do so and continue to work on her diet and weight loss.  She is also overdue for mammogram and Pap smear. I will see if we can get her in for the free Pap smear clinic coming up sitting. Also to see who we might need to contact her scholarship for free mammogram through the [facility].

## 2011-09-04 ENCOUNTER — Other Ambulatory Visit: Payer: Self-pay | Admitting: Family Medicine

## 2011-09-13 ENCOUNTER — Other Ambulatory Visit: Payer: Self-pay | Admitting: Family Medicine

## 2011-09-15 ENCOUNTER — Telehealth: Payer: Self-pay | Admitting: Family Medicine

## 2011-09-15 NOTE — Telephone Encounter (Signed)
Pt's husband calling and said his wife takes insulin shots, and she is wanting to know if she can give the insulin in the legs. Plan:  Told pt's husband usually it is best to do the abdomen or the back of the arms sub Q for someone giving their own injections. Jarvis Newcomer, LPN [facility]

## 2011-09-18 IMAGING — CR DG CERVICAL SPINE 2 OR 3 VIEWS
3 series · 3 of 3 positions shown · non-contrast
Comparison: None.

CLINICAL DATA: 58-year-old female with right arm and finger
numbness.  No known trauma.

CERVICAL SPINE - 2-3 VIEW

[view not recorded (1 of 3)]
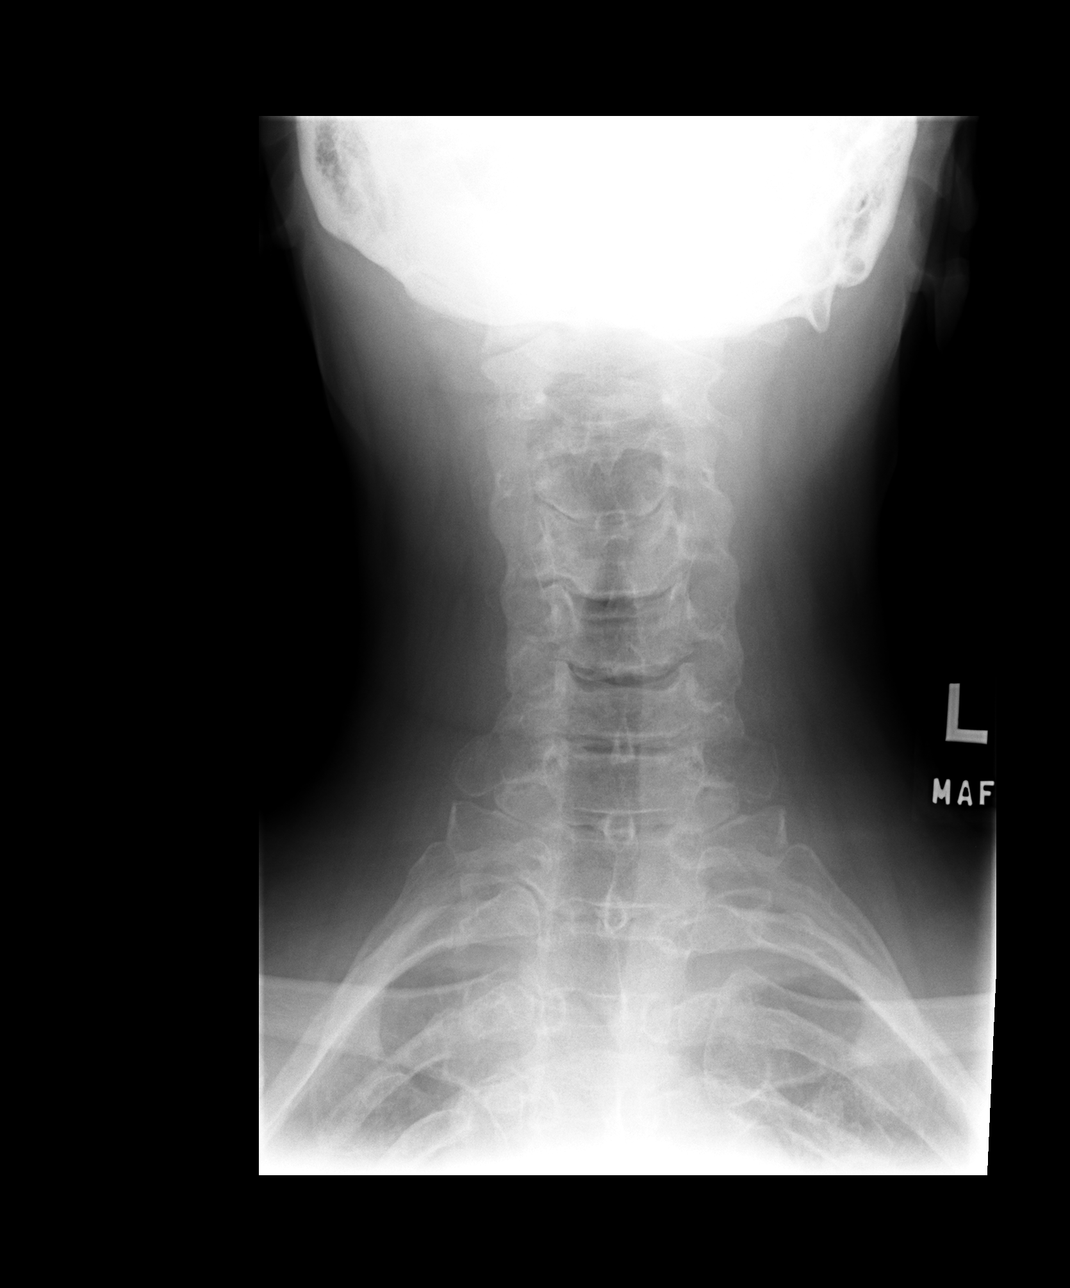

[view not recorded (2 of 3)]
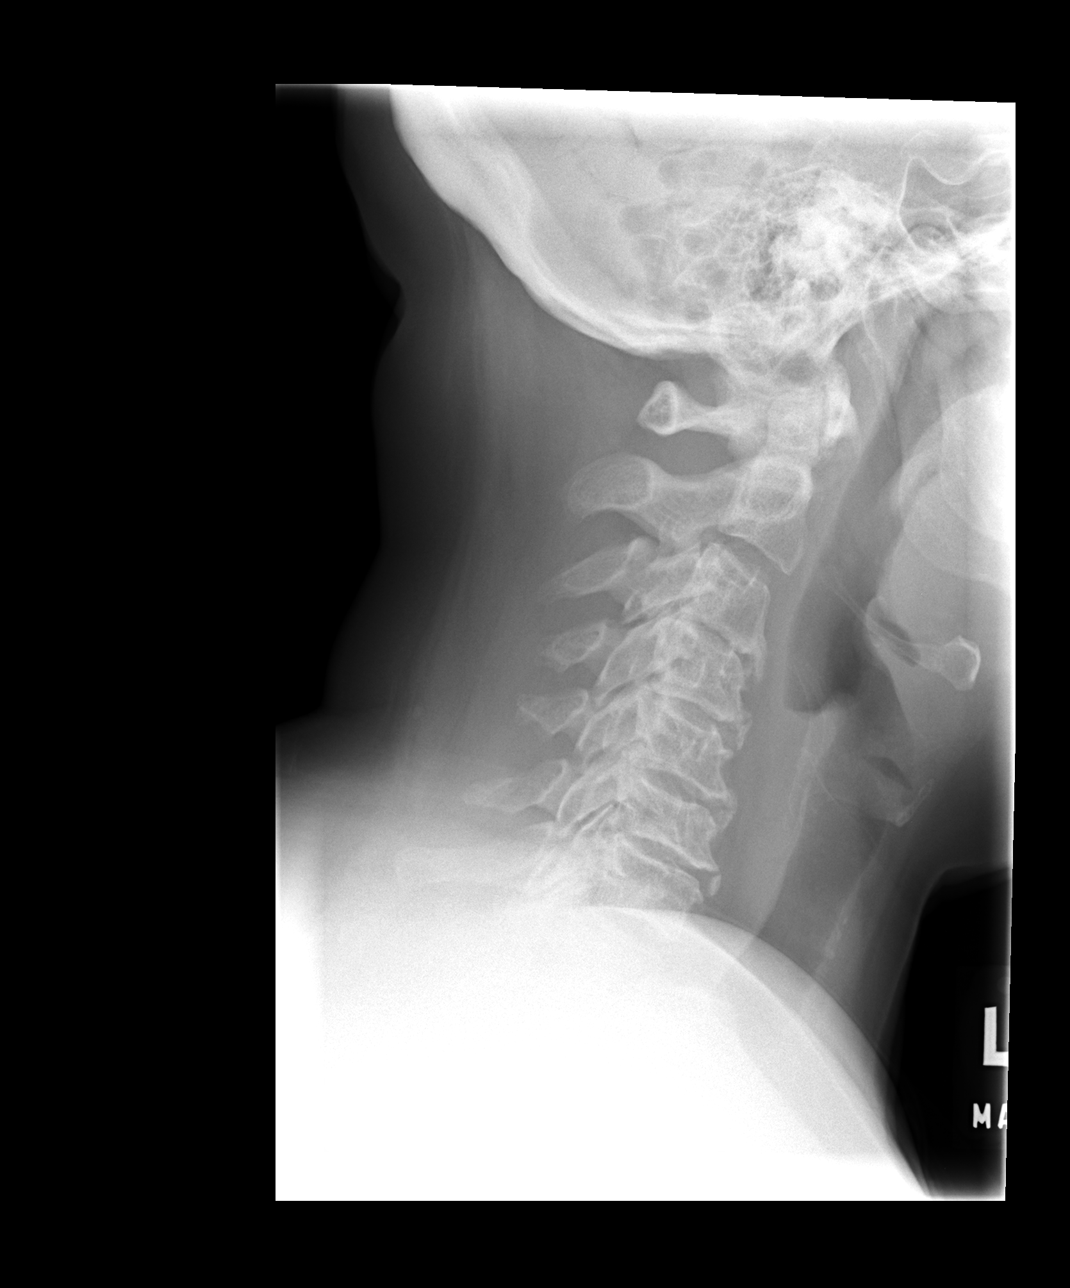

[view not recorded (3 of 3)]
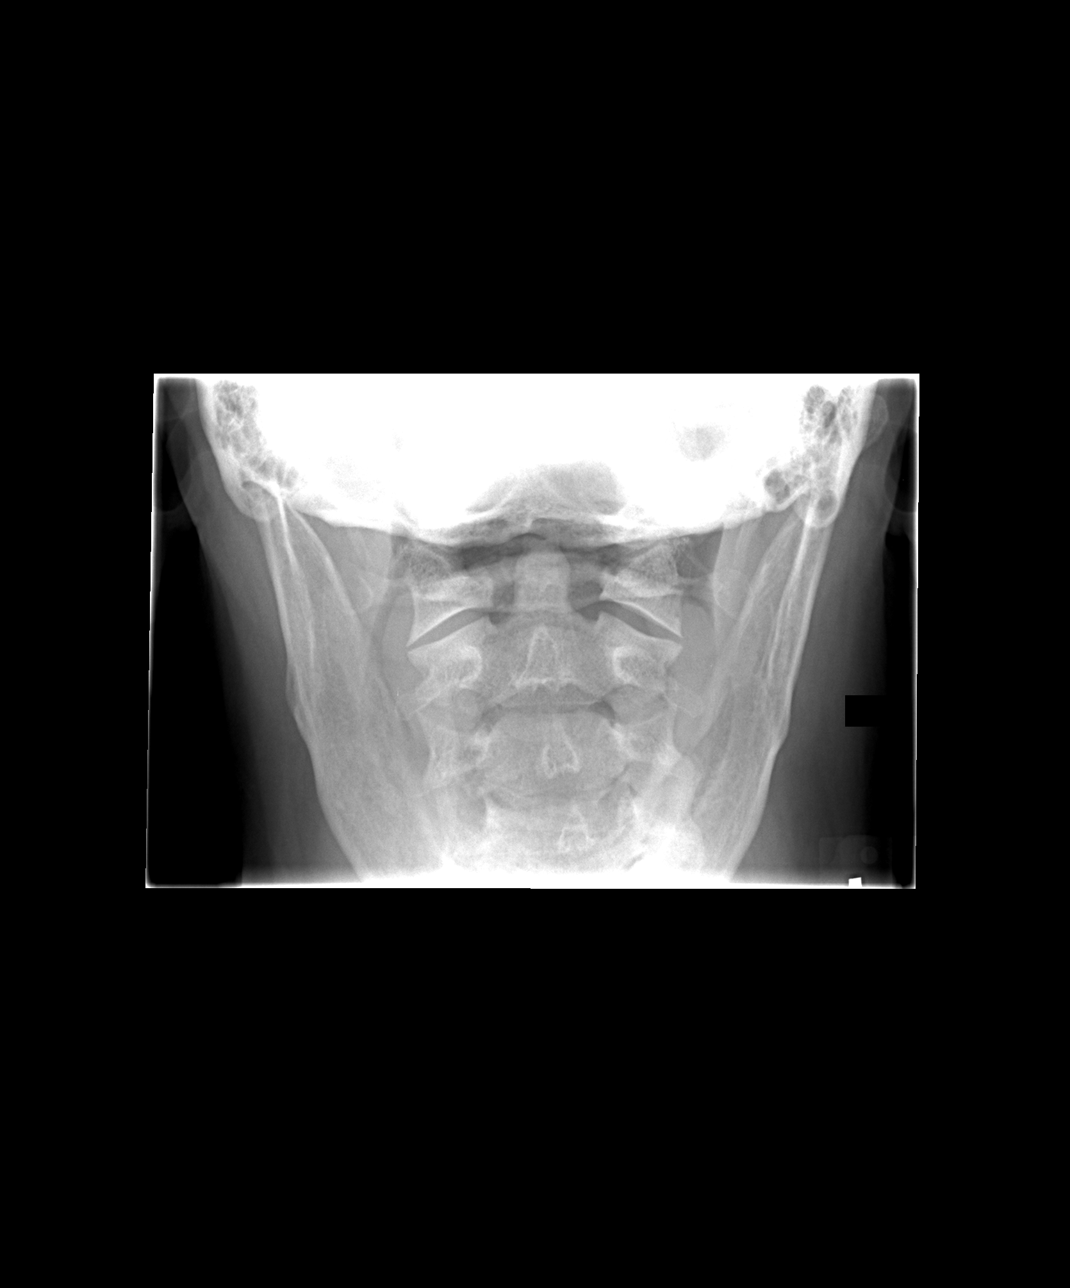

[3 of 3 positions shown; findings below may reference images not displayed]

FINDINGS: Reversal of cervical lordosis.  Prevertebral soft tissues
are within normal limits.  Cervical disc space narrowing and bulky
endplate osteophytosis are present from C3-C4 through C6-C7.  C1-C2
alignment and odontoid process are within normal limits.
IMPRESSION: Diffuse chronic cervical disc degeneration with bulky endplate
osteophytes.

## 2011-12-04 ENCOUNTER — Ambulatory Visit: Payer: Self-pay | Admitting: Family Medicine

## 2011-12-06 ENCOUNTER — Other Ambulatory Visit: Payer: Self-pay | Admitting: Family Medicine

## 2012-01-22 ENCOUNTER — Encounter: Payer: Self-pay | Admitting: *Deleted

## 2012-01-27 ENCOUNTER — Ambulatory Visit: Payer: Self-pay | Admitting: Family Medicine

## 2012-02-02 ENCOUNTER — Telehealth: Payer: Self-pay | Admitting: *Deleted

## 2012-02-02 NOTE — Telephone Encounter (Signed)
Pt states that she has a runny nose and a headache. Pt would like to know if she can take OTC for these symptoms. Please advise.

## 2012-02-02 NOTE — Telephone Encounter (Signed)
Pt informed

## 2012-02-02 NOTE — Telephone Encounter (Signed)
Can use Tylenol for her headache and claritin 10mg  (without the "D") once a day for the runny nose.

## 2012-02-11 ENCOUNTER — Other Ambulatory Visit: Payer: Self-pay | Admitting: Family Medicine

## 2012-03-25 ENCOUNTER — Other Ambulatory Visit: Payer: Self-pay | Admitting: Family Medicine

## 2012-04-01 ENCOUNTER — Ambulatory Visit: Payer: Self-pay | Admitting: Family Medicine

## 2012-04-27 ENCOUNTER — Ambulatory Visit: Payer: Self-pay | Admitting: Family Medicine

## 2012-05-05 ENCOUNTER — Other Ambulatory Visit: Payer: Self-pay | Admitting: Family Medicine

## 2012-06-10 ENCOUNTER — Other Ambulatory Visit: Payer: Self-pay | Admitting: Family Medicine

## 2012-06-16 ENCOUNTER — Encounter: Payer: Self-pay | Admitting: Physician Assistant

## 2012-06-16 ENCOUNTER — Ambulatory Visit (INDEPENDENT_AMBULATORY_CARE_PROVIDER_SITE_OTHER): Payer: Self-pay | Admitting: Physician Assistant

## 2012-06-16 VITALS — BP 157/104 | HR 93 | Temp 98.5°F | Ht 67.0 in | Wt 215.0 lb

## 2012-06-16 DIAGNOSIS — M79604 Pain in right leg: Secondary | ICD-10-CM

## 2012-06-16 DIAGNOSIS — M7989 Other specified soft tissue disorders: Secondary | ICD-10-CM

## 2012-06-16 DIAGNOSIS — M79609 Pain in unspecified limb: Secondary | ICD-10-CM

## 2012-06-16 MED ORDER — METOPROLOL SUCCINATE ER 25 MG PO TB24: 25.0000 mg | ORAL_TABLET | Freq: Every day | ORAL | Status: DC

## 2012-06-16 MED ORDER — LISINOPRIL-HYDROCHLOROTHIAZIDE 20-25 MG PO TABS: 1.0000 | ORAL_TABLET | Freq: Every day | ORAL | Status: DC

## 2012-06-16 NOTE — Patient Instructions (Addendum)
Stay on Asprin. Ibuprofen 400mg  three times a day for pain. Ice affected area. Will call tomorrow with lab.   Keep feet elevated. Ice affected area. Rest leg.   Call office if bloodpressure does not decrease less than 130/90. [facility]

## 2012-06-16 NOTE — Progress Notes (Signed)
  Subjective:    Patient ID: Gina Lynch, female    DOB: January 16, 1951, 61 y.o.   MRN: [id number]  HPI Pt presents to the clinic with right painful swollen leg. She fell on right leg in June of this year. It has continued to get more and more painful. It has had swelling and is now red. She denies any fever, chills, SOB. She can still bear weight and there is no open wounds. She has done nothing to make better or worse. She does take daily ASA. NO recent surgery.  She has not had blood pressure pills in 4 days. Needs refills. Denies any CP, palpitations, or SOB.    Review of Systems     Objective:   Physical Exam  Constitutional: She is oriented to person, place, and time. She appears well-developed and well-nourished.  HENT:  Head: Normocephalic and atraumatic.  Cardiovascular: Normal rate, regular rhythm, normal heart sounds and intact distal pulses.   Pulmonary/Chest: Effort normal and breath sounds normal. She has no wheezes.  Musculoskeletal:       ROM is limited with flexion of right knee due to pain.  Neurological: She is alert and oriented to person, place, and time.  Skin:       Right leg was felt warm to the touch more than left. Slight swelling of right leg compared to left and slightly more red. Denies any tenderness to palpation of the right calf.   Psychiatric: She has a normal mood and affect. Her behavior is normal.          Assessment & Plan:  Right leg pain and swelling- Ordered D-dimer and Ck level because patient does not have money to get ultrasound and wanted to do this first to see if doppler of right leg was necessary. She was encouraged to stay on ASA. REST, IcE, Elevate right leg right now. Will call with labs. Ibuprofen can also be used for pain.   HTN- Medications were refilled today. Need to monitor BP and if does not decrease below 130/90 then need to call office. Pt was told she needed bloodwork to check kidneys. She stated she cannot afford today. I  also told her we needed to recheck a1c. She also denied b/c of money. I discuessed [facility] for free healthcare. Pt is aware she is in need of a lot of health maintaince that she cannot afford.

## 2012-06-17 ENCOUNTER — Telehealth: Payer: Self-pay | Admitting: *Deleted

## 2012-06-17 ENCOUNTER — Ambulatory Visit (HOSPITAL_BASED_OUTPATIENT_CLINIC_OR_DEPARTMENT_OTHER)
Admission: RE | Admit: 2012-06-17 | Discharge: 2012-06-17 | Disposition: A | Discharge status: Home / Self Care | Payer: Self-pay | Source: Ambulatory Visit | Attending: Family Medicine | Admitting: Family Medicine

## 2012-06-17 DIAGNOSIS — R7989 Other specified abnormal findings of blood chemistry: Secondary | ICD-10-CM

## 2012-06-17 DIAGNOSIS — M7989 Other specified soft tissue disorders: Secondary | ICD-10-CM | POA: Insufficient documentation

## 2012-06-17 DIAGNOSIS — M79604 Pain in right leg: Secondary | ICD-10-CM

## 2012-06-17 DIAGNOSIS — M79609 Pain in unspecified limb: Secondary | ICD-10-CM | POA: Insufficient documentation

## 2012-06-17 LAB — D-DIMER, QUANTITATIVE: D-Dimer, Quant: 0.6 ug/mL-FEU — ABNORMAL HIGH (ref 0.00–0.48)

## 2012-06-17 NOTE — Telephone Encounter (Signed)
Scan scheduled and son informed and is taking pt to [facility] location. They will call you with results before they let pt leave.

## 2012-06-17 NOTE — Telephone Encounter (Signed)
Please look at pt's results since Lesly Rubenstein is not here today.

## 2012-06-17 NOTE — Telephone Encounter (Signed)
Yes, she needs a doppler of the right lower extremity. Is she ok with going to [place] for this.

## 2012-06-18 ENCOUNTER — Telehealth: Payer: Self-pay | Admitting: *Deleted

## 2012-06-18 MED ORDER — HYDROCODONE-ACETAMINOPHEN 5-325 MG PO TABS: 1.0000 | ORAL_TABLET | Freq: Four times a day (QID) | ORAL | Status: DC | PRN

## 2012-06-18 NOTE — Telephone Encounter (Signed)
Pt informed

## 2012-06-18 NOTE — Telephone Encounter (Signed)
Called patient and gave her reassurance. 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 Since she cannot afford PT. Will consider scheduling for injection for cyst if still continuing to have pain. Continue NSAID, REST< IcE, elevation.

## 2012-06-18 NOTE — Telephone Encounter (Signed)
Remind patient to REST, ICE, Compress( with ace bandage or OTC stockings) and elevate. If still having that much pain we could get an x-ray on Monday to make sure that there is no fracture/break.  Sent norco to take every 6 hours as needed for pain.

## 2012-06-18 NOTE — Telephone Encounter (Signed)
Husband called stating pt would like something for the pain and swelling in her leg. Please advise.

## 2012-06-18 NOTE — Telephone Encounter (Signed)
Pt comes in the office today wanting to know what to do about her leg pain. 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 Explained she could elevate the leg, ice and take her pain meds and I would forward Jade the message to see what she wanted to do

## 2012-06-29 ENCOUNTER — Telehealth: Payer: Self-pay | Admitting: *Deleted

## 2012-06-29 NOTE — Telephone Encounter (Signed)
Pt is asking if it is ok for her to take Sundown B12 vitamins.

## 2012-06-29 NOTE — Telephone Encounter (Signed)
Yes that is ok, Does she want me to check her level first?

## 2012-06-30 NOTE — Telephone Encounter (Signed)
Husband states he will ask pt if she wants the level checked. If so then they will call back for the order to lab.

## 2012-08-25 ENCOUNTER — Other Ambulatory Visit: Payer: Self-pay | Admitting: Family Medicine

## 2012-09-24 ENCOUNTER — Other Ambulatory Visit: Payer: Self-pay | Admitting: *Deleted

## 2012-09-24 MED ORDER — HYDROCODONE-ACETAMINOPHEN 5-325 MG PO TABS: 1.0000 | ORAL_TABLET | Freq: Four times a day (QID) | ORAL | Status: AC | PRN

## 2012-09-24 NOTE — Telephone Encounter (Signed)
Pt calls and request a refill on the Hydrocodone 5/325mg  that you gave her for her leg pain. [facility] [place].

## 2012-09-24 NOTE — Telephone Encounter (Signed)
Ok to refill quanity 20 for bakers cyst.

## 2012-09-27 ENCOUNTER — Encounter: Payer: Self-pay | Admitting: Family Medicine

## 2012-09-27 ENCOUNTER — Ambulatory Visit (INDEPENDENT_AMBULATORY_CARE_PROVIDER_SITE_OTHER): Payer: Self-pay | Admitting: Family Medicine

## 2012-09-27 VITALS — BP 184/100 | HR 70 | Ht 67.0 in | Wt 216.0 lb

## 2012-09-27 DIAGNOSIS — M7120 Synovial cyst of popliteal space [Baker], unspecified knee: Secondary | ICD-10-CM

## 2012-09-27 DIAGNOSIS — I10 Essential (primary) hypertension: Secondary | ICD-10-CM

## 2012-09-27 DIAGNOSIS — E119 Type 2 diabetes mellitus without complications: Secondary | ICD-10-CM

## 2012-09-27 MED ORDER — TRAMADOL HCL 50 MG PO TABS: 50.0000 mg | ORAL_TABLET | Freq: Three times a day (TID) | ORAL | Status: DC | PRN

## 2012-09-27 NOTE — Patient Instructions (Signed)
Go onto www.lantus.com to see if have forms to download to see if qualify for assitance througt the drug company.

## 2012-09-27 NOTE — Progress Notes (Signed)
  Subjective:    Patient ID: Tenna Child, female    DOB: 1951-07-15, 61 y.o.   MRN: [id number]  HPI Back on July she fell ( not sure why) and hurt her right leg and it was swollen initially. She did have a baker's cyst on that side but now much bigger.  She has been elevated it and icing it. Helps some. She is worried it is cancer.   HTN - Ran out of meds yesterday. No CP or SOB.    DM - Occ gets lightheaded. No cuts or sores that aren't well. No hypoglycemic events.  Sugars are running up a litlte. OUt of her insulin.  Sugar this Am is 188.  Occ gets blurry vision.  Lab Results  Component Value Date   HGBA1C 8.6 09/02/2011      Review of Systems     Objective:   Physical Exam  Constitutional: She is oriented to person, place, and time. She appears well-developed and well-nourished.  HENT:  Head: Normocephalic and atraumatic.  Cardiovascular: Normal rate, regular rhythm and normal heart sounds.   Pulmonary/Chest: Effort normal and breath sounds normal.  Musculoskeletal:       Trace edema of the right knee with large bakers cysts.  Nontender. Normal extension. Decreased flexion.  Neurological: She is alert and oriented to person, place, and time.  Skin: Skin is warm and dry.  Psychiatric: She has a normal mood and affect. Her behavior is normal.          Assessment & Plan:  DM- uncontrolled. continue metformin and Glucotrol. I gave her samples of Levemir today. She's been unable to afford her insulin for at least 6 months. I did encourage her to go to the drug company website to see if she can apply for some assistance and she does not have health insurance. Lab Results  Component Value Date   HGBA1C 9.2 09/27/2012     Hypertension-uncontrolled. She ran out of her medication yesterday but promises she will pick it up today. She has called and the results. I would like to recheck her blood pressure as soon as we can that she doesn't have insurance it is difficult for her to  get here.  Bakers cyst-gave reassurance. Handout given about the condition itself. It expired her we do not typically injecting simple fluid off of them. Continue with compression, icing it, elevation.  Her that I think the trauma of falling a few months ago most likely caused a increase in size of her Baker's cyst. If her pain continues I recommend orthopedic referral. Right now she does not have insurance so she definitely wants to hold off on that and try conservative therapy as long as possible. I reassured her that is not consistent with a tumor or growth.

## 2012-10-30 ENCOUNTER — Other Ambulatory Visit: Payer: Self-pay | Admitting: Family Medicine

## 2012-11-08 ENCOUNTER — Ambulatory Visit (INDEPENDENT_AMBULATORY_CARE_PROVIDER_SITE_OTHER): Payer: Self-pay | Admitting: Family Medicine

## 2012-11-08 ENCOUNTER — Encounter: Payer: Self-pay | Admitting: Family Medicine

## 2012-11-08 VITALS — BP 144/94 | HR 78 | Ht 68.0 in | Wt 211.0 lb

## 2012-11-08 DIAGNOSIS — E039 Hypothyroidism, unspecified: Secondary | ICD-10-CM

## 2012-11-08 DIAGNOSIS — I10 Essential (primary) hypertension: Secondary | ICD-10-CM

## 2012-11-08 MED ORDER — METOPROLOL TARTRATE 50 MG PO TABS: 50.0000 mg | ORAL_TABLET | Freq: Two times a day (BID) | ORAL | Status: DC

## 2012-11-08 NOTE — Progress Notes (Signed)
  Subjective:    Patient ID: Gina Lynch, female    DOB: [birth year], 61 y.o.   MRN: [id number]  HPI DM- Taking her lantus this time.  Using 14 units. Sugar is still occ running 400 at time.  She has been exercising some.  She has lost 5 lbs.    HTN- restarted her medications.  Has lost 5 lbs.    Bakers cyst-she actually says it's getting a little bit better. She feels it's smaller and is coughing less discomfort.   Review of Systems     Objective:   Physical Exam  Constitutional: She is oriented to person, place, and time. She appears well-developed and well-nourished.  HENT:  Head: Normocephalic and atraumatic.  Cardiovascular: Normal rate, regular rhythm and normal heart sounds.   Pulmonary/Chest: Effort normal and breath sounds normal.  Neurological: She is alert and oriented to person, place, and time.  Skin: Skin is warm and dry.  Psychiatric: She has a normal mood and affect. Her behavior is normal.          Assessment & Plan:  DM- Increase Lantus one unit a day, until sugar is under 150.  Continue metformin. Also she may want to see with the pharmacist to see if the twice a day glipizide is cheaper. It may be on the portal is compared to the extended release version. Followup in 3 months. Unfortunately we did not have any Lantus samples today. She has complete paperwork to try to get medication directly from the pharmaceutical company which is fantastic.  HTN- Uncontrolled. Better today but not at goal.  Will increase metoprolol to 50mg  BID.  She just filled the 25 mg prescription. When she went out one month she can refill the metoprolol 50 mg twice a day. This should also be cheaper.    Hypothyroid - Need to recheck TSH.    She is overdue for mammogram and Pap smear. Unfortunately still does not have insurance. I will see if I can get her the information for the free Pap smear screenings for this year as well as give her the phone number for the mammogram scholarships  they're available through the [facility] long period  Baker's cyst. Improving.

## 2012-11-25 ENCOUNTER — Telehealth: Payer: Self-pay | Admitting: *Deleted

## 2012-11-25 MED ORDER — INSULIN GLARGINE 100 UNIT/ML ~~LOC~~ SOLN: 10.0000 [IU] | Freq: Every day | SUBCUTANEOUS | Status: DC

## 2012-11-25 NOTE — Telephone Encounter (Signed)
Pt's spouse called and said they need a written rx for Lantus to submit along with paper that was filled out for assistance. tjhey would like to pick up rx today

## 2013-02-04 ENCOUNTER — Ambulatory Visit: Payer: Self-pay | Admitting: Family Medicine

## 2013-02-12 ENCOUNTER — Other Ambulatory Visit: Payer: Self-pay | Admitting: Family Medicine

## 2013-02-14 ENCOUNTER — Other Ambulatory Visit: Payer: Self-pay | Admitting: Family Medicine

## 2013-04-14 ENCOUNTER — Other Ambulatory Visit: Payer: Self-pay | Admitting: Family Medicine

## 2013-06-02 ENCOUNTER — Other Ambulatory Visit: Payer: Self-pay

## 2013-06-14 ENCOUNTER — Ambulatory Visit: Payer: Self-pay | Admitting: Family Medicine

## 2013-07-14 ENCOUNTER — Other Ambulatory Visit: Payer: Self-pay | Admitting: Family Medicine

## 2013-07-14 ENCOUNTER — Other Ambulatory Visit: Payer: Self-pay | Admitting: Physician Assistant

## 2013-07-14 NOTE — Telephone Encounter (Signed)
Needs appt. Last refill.

## 2013-07-19 ENCOUNTER — Ambulatory Visit: Payer: Self-pay | Admitting: Family Medicine

## 2013-10-03 ENCOUNTER — Ambulatory Visit: Payer: Self-pay | Admitting: Sports Medicine

## 2013-10-26 ENCOUNTER — Ambulatory Visit: Payer: Self-pay | Admitting: Family Medicine

## 2013-11-01 ENCOUNTER — Ambulatory Visit (INDEPENDENT_AMBULATORY_CARE_PROVIDER_SITE_OTHER): Payer: Self-pay | Admitting: Family Medicine

## 2013-11-01 ENCOUNTER — Encounter: Payer: Self-pay | Admitting: Family Medicine

## 2013-11-01 VITALS — BP 190/116 | HR 75 | Temp 97.3°F | Wt 198.0 lb

## 2013-11-01 DIAGNOSIS — E785 Hyperlipidemia, unspecified: Secondary | ICD-10-CM

## 2013-11-01 DIAGNOSIS — I10 Essential (primary) hypertension: Secondary | ICD-10-CM

## 2013-11-01 MED ORDER — GLIPIZIDE 10 MG PO TABS: 10.0000 mg | ORAL_TABLET | Freq: Two times a day (BID) | ORAL | Status: DC

## 2013-11-01 MED ORDER — METFORMIN HCL 1000 MG PO TABS: ORAL_TABLET | ORAL | Status: DC

## 2013-11-01 MED ORDER — LISINOPRIL-HYDROCHLOROTHIAZIDE 20-25 MG PO TABS: 1.0000 | ORAL_TABLET | Freq: Every day | ORAL | Status: DC

## 2013-11-01 MED ORDER — TRAMADOL HCL 50 MG PO TABS: 50.0000 mg | ORAL_TABLET | Freq: Three times a day (TID) | ORAL | Status: DC | PRN

## 2013-11-01 MED ORDER — LEVOTHYROXINE SODIUM 75 MCG PO TABS: ORAL_TABLET | ORAL | Status: DC

## 2013-11-01 MED ORDER — MELOXICAM 7.5 MG PO TABS: ORAL_TABLET | ORAL | Status: DC

## 2013-11-01 MED ORDER — METOPROLOL TARTRATE 50 MG PO TABS: 50.0000 mg | ORAL_TABLET | Freq: Two times a day (BID) | ORAL | Status: DC

## 2013-11-01 MED ORDER — INSULIN GLARGINE 100 UNIT/ML ~~LOC~~ SOLN: 10.0000 [IU] | Freq: Every day | SUBCUTANEOUS | Status: DC

## 2013-11-01 MED ORDER — PRAVASTATIN SODIUM 40 MG PO TABS: 40.0000 mg | ORAL_TABLET | Freq: Every day | ORAL | Status: AC

## 2013-11-01 NOTE — Progress Notes (Signed)
   Subjective:    Patient ID: Gina Lynch, female    DOB: March 16, 1951, 62 y.o.   MRN: [id number]  HPI HTN -  Pt denies chest pain, SOB, dizziness, or heart palpitations.  Taking meds as directed w/o problems.  Denies medication side effects.    DM- has been out of her medications because she did not have an appointment so long. She denies having any hypoglycemic events. She's been unable to check her blood sugars. She denies any numbness or tingling.  Hypothyroidism-she's been off her medication for almost a year. She lost her job and has found a short-term temporary job. She is currently applying for health insurance.  Review of Systems     Objective:   Physical Exam  Constitutional: She is oriented to person, place, and time. She appears well-developed and well-nourished.  HENT:  Head: Normocephalic and atraumatic.  Cardiovascular: Normal rate, regular rhythm and normal heart sounds.   Pulmonary/Chest: Effort normal and breath sounds normal.  Neurological: She is alert and oriented to person, place, and time.  Skin: Skin is warm and dry.  Psychiatric: She has a normal mood and affect. Her behavior is normal.          Assessment & Plan:  Hypertension-uncontrolled off of medication. Will restart them today. Changed her metoprolol succinate metoprolol tartrate for cost reasons. I did write for 90 day prescription so that she can get him for less cost at [facility].  Diabetes- uncontrolled but off medication for on eyear.  A1C is 12.0 today. Work on diet and exercise and will restart medication.  Changed some to cheaper alternatives. Changed her glipizide extended release to regular release for cost reasons. Unfortunately we do not have any samples of Lantus but we did give her the phone number to call to see if she would qualify for assistance through the drug company itself, so she does not have health insurance currently.  Hypothyroidism-will recheck thyroid when able.  She is  hoping to get insurance the third part of the year and so hopefully when I see her back in March we can get a full blood panel.

## 2014-01-25 ENCOUNTER — Other Ambulatory Visit: Payer: Self-pay | Admitting: Family Medicine

## 2014-01-27 ENCOUNTER — Other Ambulatory Visit: Payer: Self-pay | Admitting: *Deleted

## 2014-01-27 MED ORDER — TRAMADOL HCL 50 MG PO TABS: ORAL_TABLET | ORAL | Status: AC

## 2014-01-30 ENCOUNTER — Ambulatory Visit: Payer: Self-pay | Admitting: Family Medicine

## 2014-02-15 IMAGING — US US EXTREM LOW VENOUS*R*
1 series · 14 of 24 positions shown · non-contrast
Comparison: None.

CLINICAL DATA: Right lower extremity pain and swelling.

RIGHT LOWER EXTREMITY VENOUS DUPLEX ULTRASOUND
TECHNIQUE: Gray-scale sonography with graded compression, as well
as color Doppler and duplex ultrasound were performed to evaluate
the deep venous system of the lower extremity from the level of the
common femoral vein through the popliteal and proximal calf veins.
Spectral Doppler was utilized to evaluate flow at rest and with
distal augmentation maneuvers.

[Series 1: us extrem low venous*right* · 14 of 30 slices shown]
[im 1/30]
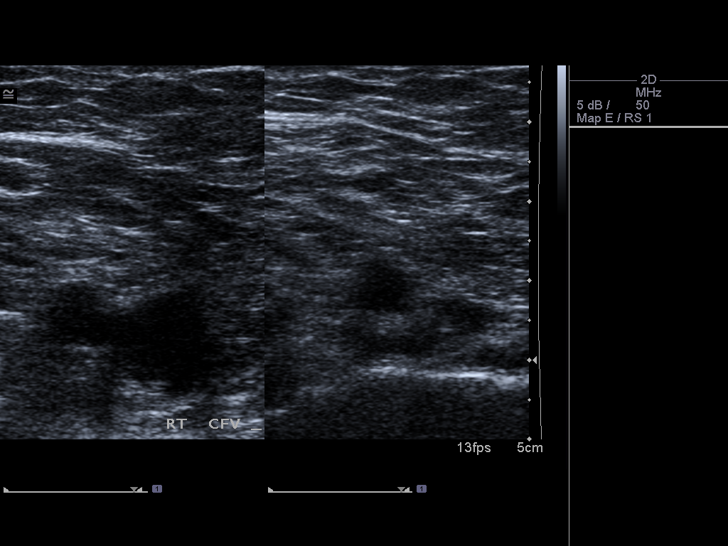
[im 3/30]
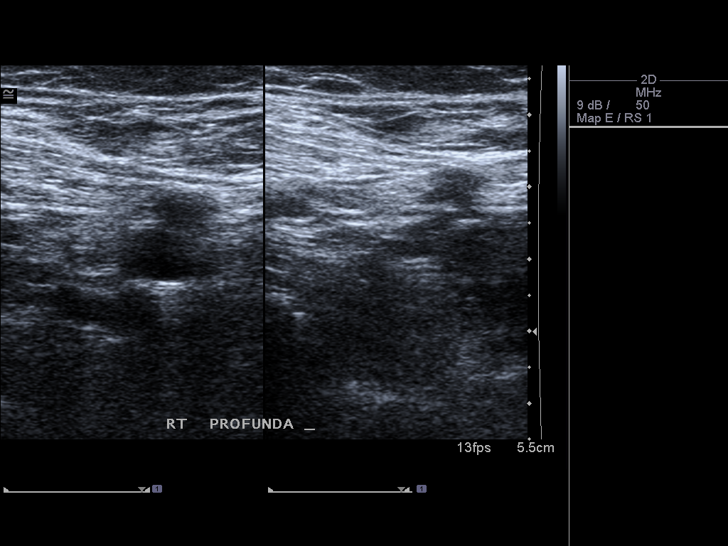
[im 6/30]
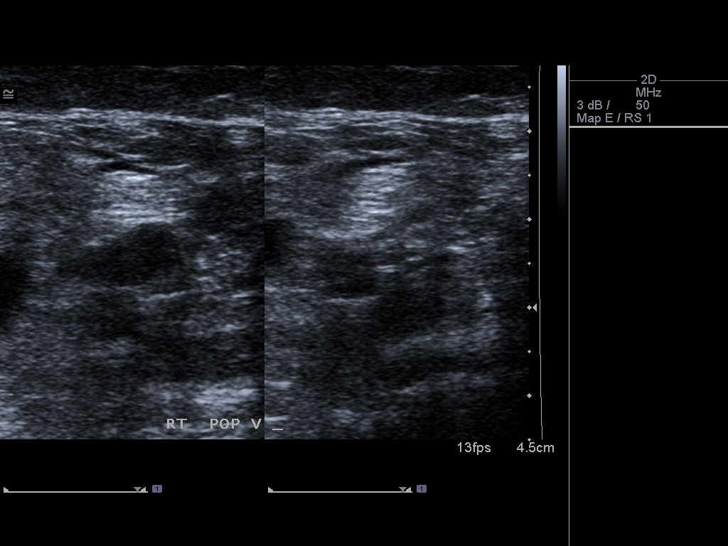
[im 8/30]
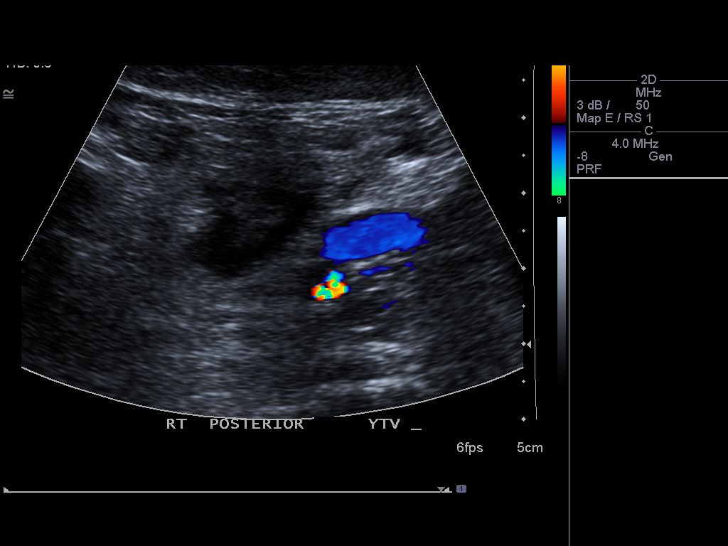
[im 9/30]
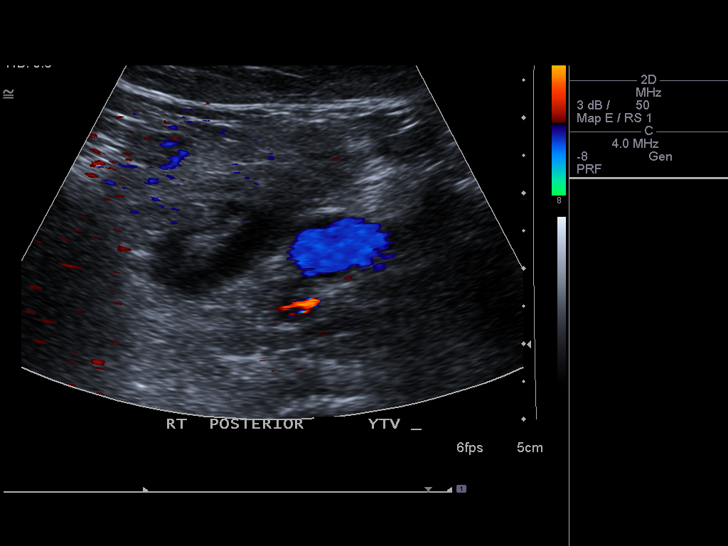
[im 12/30]
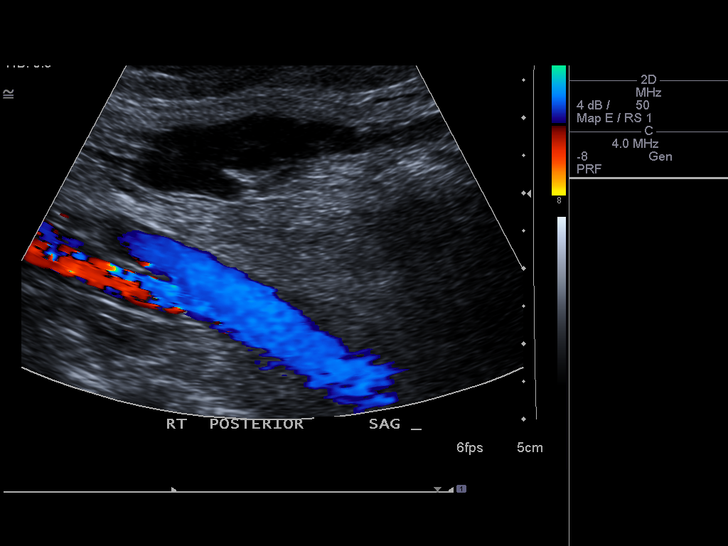
[im 14/30]
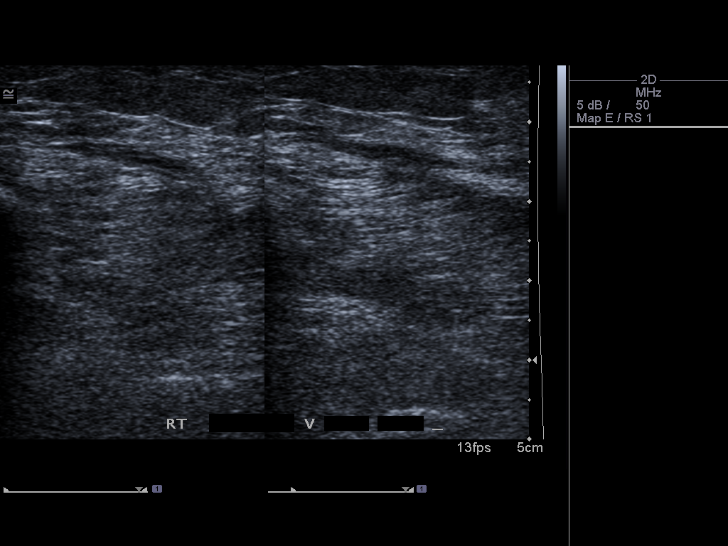
[im 16/30]
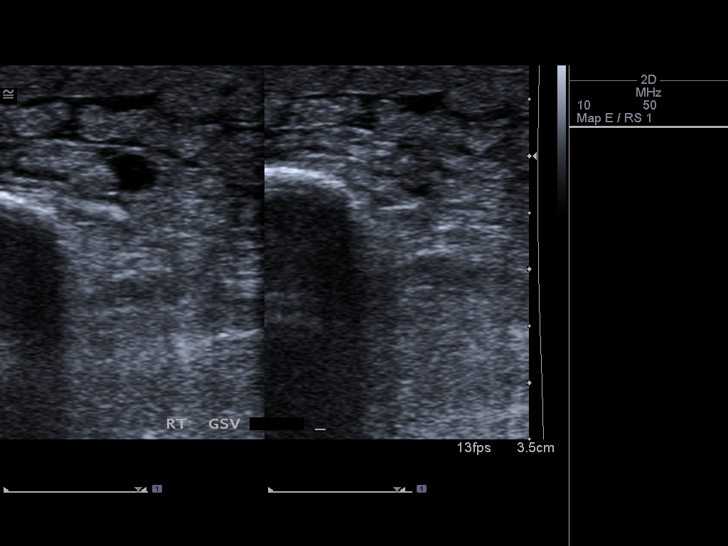
[im 18/30]
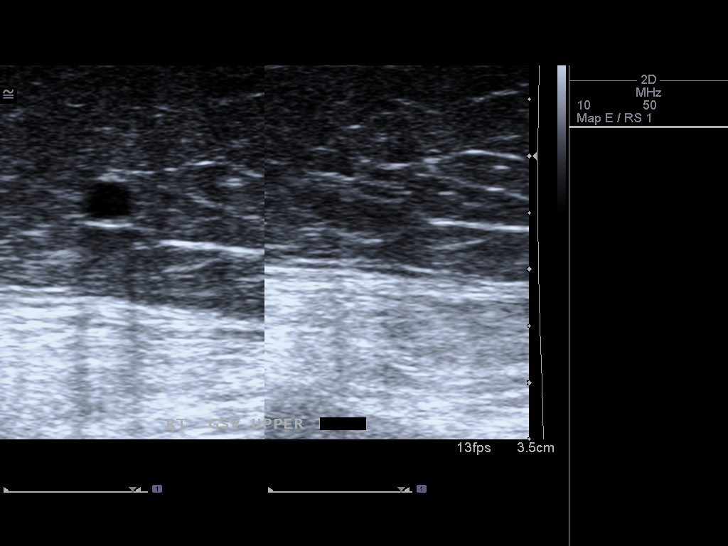
[im 21/30]
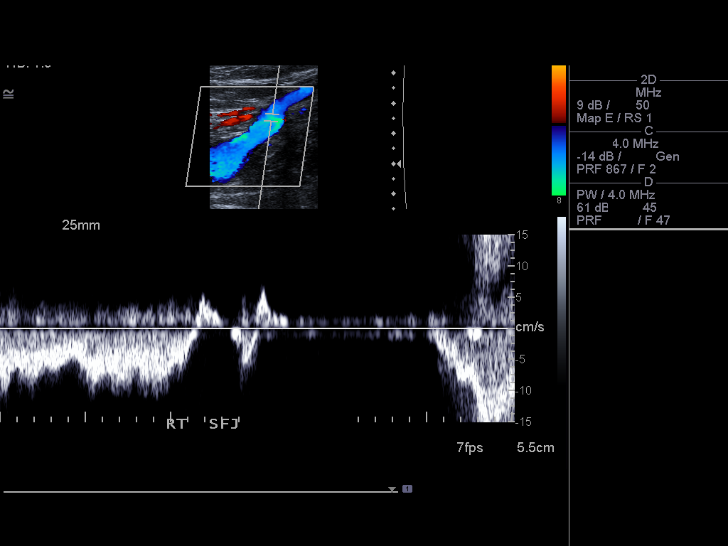
[im 23/30]
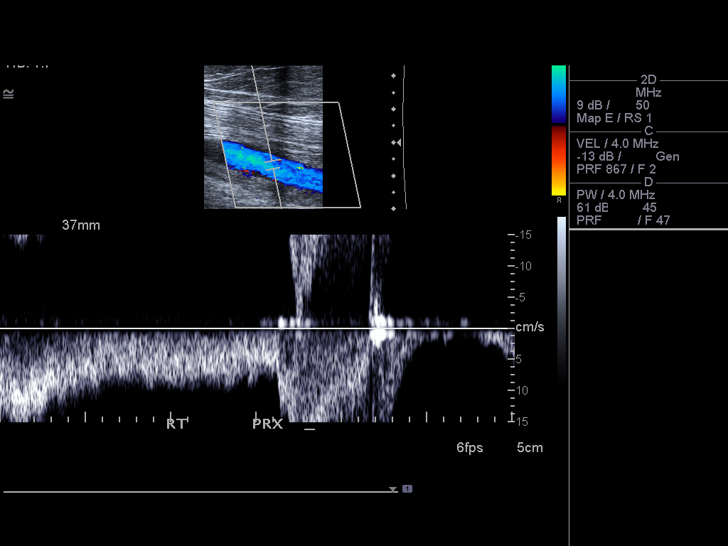
[im 24/30]
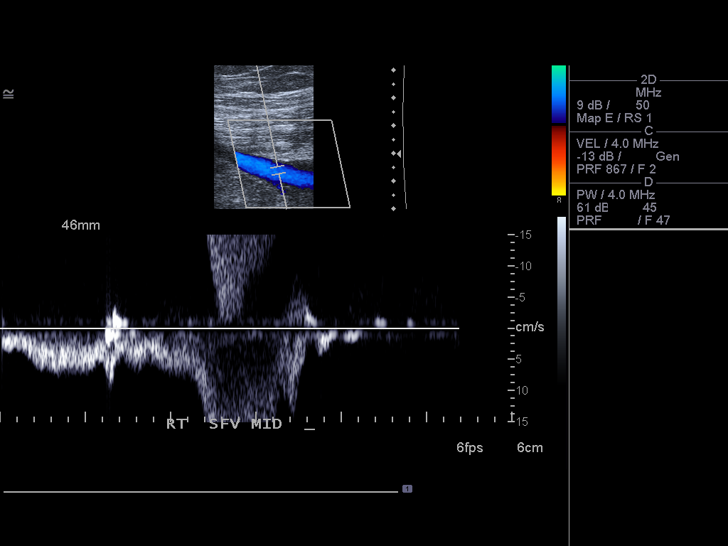
[im 27/30]
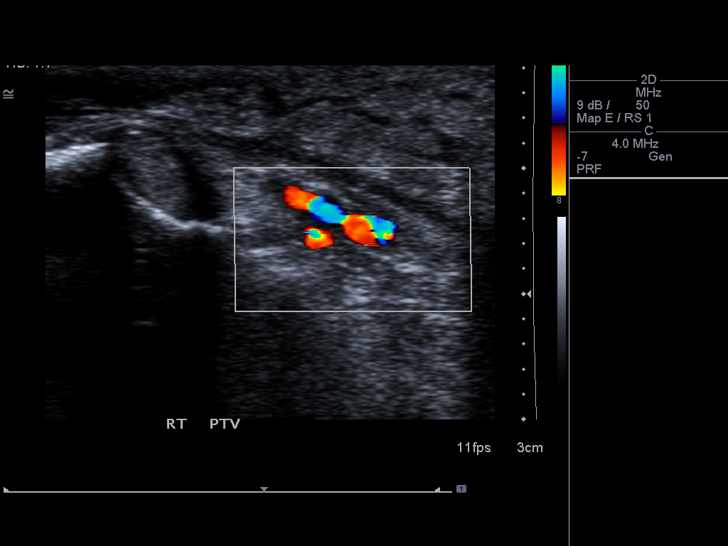
[im 30/30]
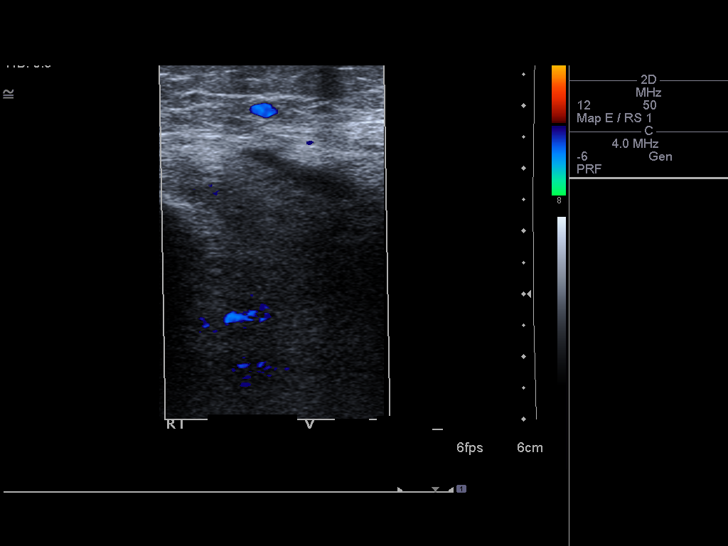

[14 of 24 positions shown; findings below may reference images not displayed]

FINDINGS: Normal compressibility of the common femoral,
superficial femoral, and popliteal veins is demonstrated, as well
as the visualized proximal calf veins.  No filling defects to
suggest DVT on grayscale or color Doppler imaging.  Doppler
waveforms show normal direction of venous flow, normal respiratory
phasicity and response to augmentation.

Subcutaneous edema identified in the region of the ankle.  There is
a small Baker's cyst in the popliteal fossa measuring approximately
2.9 x 1.0 x 1.7 cm.  This is not causing visible compression of the
popliteal vessels.
IMPRESSION: No evidence of right lower extremity DVT.  Baker's cyst noted.

## 2014-04-18 ENCOUNTER — Ambulatory Visit: Payer: Self-pay | Admitting: Family Medicine

## 2014-06-06 ENCOUNTER — Telehealth: Payer: Self-pay

## 2014-06-06 NOTE — Telephone Encounter (Signed)
Left message for patient to return call to schedule a follow up DM and lipid to meet DM bundle.

## 2014-07-05 ENCOUNTER — Other Ambulatory Visit: Payer: Self-pay | Admitting: Family Medicine

## 2014-07-13 ENCOUNTER — Telehealth: Payer: Self-pay

## 2014-07-13 NOTE — Telephone Encounter (Signed)
Left message for patient to return call. She needs a DM follow up.

## 2014-07-13 NOTE — Telephone Encounter (Signed)
FYI Husband called and states Gina Lynch needs a stronger medication for her back pain. I advised him we would need to schedule an appointment for her back pain. There is no encounter in her chart relating to back pain.

## 2014-07-13 NOTE — Telephone Encounter (Signed)
Agree. It could be musculoskeletal but could also be a urinary tract infection. It's also been over 6 months since her followup for her diabetes so would give [place] an opportunity to check on that as well. I know she doesn't have insurance so it's difficult for her to come in.

## 2014-07-14 NOTE — Telephone Encounter (Signed)
Patient called back complaining of her back pain. I advised her she will need to come in for an office visit. I explained the back pain could be due to a UTI and we would not know until we see her and test her urine. She has an appointment on Monday.

## 2014-07-17 ENCOUNTER — Encounter: Payer: Self-pay | Admitting: Family Medicine

## 2014-07-17 ENCOUNTER — Ambulatory Visit (INDEPENDENT_AMBULATORY_CARE_PROVIDER_SITE_OTHER): Payer: Self-pay | Admitting: Family Medicine

## 2014-07-17 VITALS — BP 148/102 | HR 105 | Wt 195.0 lb

## 2014-07-17 DIAGNOSIS — IMO0001 Reserved for inherently not codable concepts without codable children: Secondary | ICD-10-CM

## 2014-07-17 DIAGNOSIS — M545 Low back pain, unspecified: Secondary | ICD-10-CM

## 2014-07-17 DIAGNOSIS — E1165 Type 2 diabetes mellitus with hyperglycemia: Principal | ICD-10-CM

## 2014-07-17 LAB — POCT URINALYSIS DIPSTICK
BILIRUBIN UA: NEGATIVE
Blood, UA: NEGATIVE
Ketones, UA: NEGATIVE
LEUKOCYTES UA: NEGATIVE
Nitrite, UA: NEGATIVE
Protein, UA: 30
Spec Grav, UA: 1.025
UROBILINOGEN UA: 1
pH, UA: 5.5

## 2014-07-17 LAB — POCT UA - MICROALBUMIN
CREATININE, POC: 100 mg/dL
Microalbumin Ur, POC: 150 mg/L

## 2014-07-17 LAB — POCT GLYCOSYLATED HEMOGLOBIN (HGB A1C): Hemoglobin A1C: 11.2

## 2014-07-17 MED ORDER — KETOROLAC TROMETHAMINE 60 MG/2ML IM SOLN: 60.0000 mg | Freq: Once | INTRAMUSCULAR | Status: DC

## 2014-07-17 MED ORDER — NAPROXEN 500 MG PO TABS: 500.0000 mg | ORAL_TABLET | Freq: Two times a day (BID) | ORAL | Status: AC

## 2014-07-17 MED ORDER — CYCLOBENZAPRINE HCL 10 MG PO TABS: 5.0000 mg | ORAL_TABLET | Freq: Three times a day (TID) | ORAL | Status: AC | PRN

## 2014-07-17 MED ORDER — METFORMIN HCL 1000 MG PO TABS: ORAL_TABLET | ORAL | Status: AC

## 2014-07-17 MED ORDER — KETOROLAC TROMETHAMINE 60 MG/2ML IM SOLN
60.0000 mg | Freq: Once | INTRAMUSCULAR | Status: DC
  Administered 2014-07-17: 60 mg via INTRAMUSCULAR

## 2014-07-17 MED ORDER — GLIPIZIDE 10 MG PO TABS: 10.0000 mg | ORAL_TABLET | Freq: Two times a day (BID) | ORAL | Status: AC

## 2014-07-17 NOTE — Patient Instructions (Signed)
The naproxen if for pain and inflammation in the muscles The cyclobenzoprine is a muscle relaxer and can be used up to 3 times a day.  Can make you sleepy so don't drive with hit in your system.   Use your heating pad for about 15-20 minutes 2-3 times a day to relax the muscle.

## 2014-07-17 NOTE — Progress Notes (Signed)
   Subjective:    Patient ID: Gina Lynch, female    DOB: [birth year], 63 y.o.   MRN: [id number]  HPI Mid Back pain for several weeks.  Started after bending over sweeping Has been cleaning an office building. She has been having pain in her right shoulder and left side as well.  Using tylenol. Helps some and then comes right back.  Vomited about a week ago.  No ruinary sxs.  No fever. She did say that the pain has been keeping her awake and has even brought tears to her eyes sometimes.  DM- has followed up in some time. She does not check her sugars. She's not sure if she's had any highs or lows. She is without health insurance and has been for quite some time which is why she did not come in for office visits routinely. She should be out of meds based on refills but she says she is taking her medication.   Review of Systems     Objective:   Physical Exam  Constitutional: She is oriented to person, place, and time. She appears well-nourished.  HENT:  Head: Normocephalic and atraumatic.  Musculoskeletal:  Pain with flexion and extension and rotation right and left and side bending. She had more pain with forward flexion. Shoulders with normal range of motion. Neck with normal flexion extension and rotation. The she did have some pain turning to the right and her left upper back between her neck and shoulders. She's nontender over the spine itself. She is kind tender over both upper trapezius muscles as well as the paraspinous muscles towards the lower end of the shoulder blade on the right side of her mid back.  Neurological: She is alert and oriented to person, place, and time.  Skin: Skin is warm and dry.  Psychiatric: She has a normal mood and affect. Her behavior is normal.          Assessment & Plan:  Right mid back pain and bilateral upper trapezius pain-most consistent with musculoskeletal strain. Given a Toradol injection for acute relief. The office. She was somewhat tearful  after doing range of motion stretches. Recommending her on an anti-inflammatory which is not currently taking. We'll send of her prescription for naproxen as well as putting her on a muscle relaxer. Recommended she use her heating pad for 15-20 minutes up to 3 times a day. I did warn about potential side effects including sedation with the muscle relaxer and to not drop in her system. Urinalysis was negative for urinary tract infection.  Diabetes-uncontrolled. He will A1c is over 11. Unfortunately she does not have health insurance and is very difficult for her to come in and to afford her medications. I encouraged her to check out the assistance program through [place] [facility].

## 2014-07-17 NOTE — Addendum Note (Signed)
Addended by: Teddy Spike on: 07/17/2014 05:26 PM   Modules accepted: Orders

## 2014-09-15 ENCOUNTER — Telehealth: Payer: Self-pay | Admitting: *Deleted

## 2014-09-15 NOTE — Telephone Encounter (Signed)
Called and lvm informing debbie that Dr. Madilyn Fireman advised that she go to the ED .Gina Lynch [place]

## 2014-09-15 NOTE — Telephone Encounter (Signed)
Pt called to report that she is having a lot of nausea and some vomiting. Sometimes its with meds but usually throughout the day. No fever. She stated that she is eating a bland diet and trying to stay hydrated. Please advise.Gina Lynch, Gina Lynch

## 2014-10-04 ENCOUNTER — Telehealth: Payer: Self-pay | Admitting: Family Medicine

## 2014-10-04 ENCOUNTER — Encounter: Payer: Self-pay | Admitting: Family Medicine

## 2014-10-04 DIAGNOSIS — C3490 Malignant neoplasm of unspecified part of unspecified bronchus or lung: Secondary | ICD-10-CM | POA: Insufficient documentation

## 2014-10-04 NOTE — Telephone Encounter (Signed)
Called patient: unable to leave voicemail on mobile phone as the mailbox had not been set up. I did leave message on the home phone just let her know that I did receive the notes from [facility] where she was diagnosed with lung cancer. Encouraged her to call if she needs anything and just let her know that we are thinking about her.

## 2014-10-12 ENCOUNTER — Telehealth: Payer: Self-pay | Admitting: *Deleted

## 2014-10-12 NOTE — Telephone Encounter (Signed)
[name] with [facility] in [place] called and wanted to make Dr. Madilyn Fireman aware that she is in the hospital on the Cardiac unit. She was admitted on 10/11/14.Gina Lynch

## 2014-10-17 ENCOUNTER — Ambulatory Visit: Payer: Self-pay | Admitting: Family Medicine

## 2014-10-20 ENCOUNTER — Other Ambulatory Visit: Payer: Self-pay | Admitting: Family Medicine

## 2014-10-24 ENCOUNTER — Other Ambulatory Visit: Payer: Self-pay | Admitting: *Deleted

## 2014-10-24 DIAGNOSIS — E569 Vitamin deficiency, unspecified: Secondary | ICD-10-CM

## 2014-10-24 MED ORDER — LEVOTHYROXINE SODIUM 75 MCG PO TABS
75.0000 ug | ORAL_TABLET | Freq: Every day | ORAL | Status: AC
Start: 2014-10-24 — End: ?

## 2014-10-25 LAB — CBC
HEMATOCRIT: 29.8 % — AB (ref 36.0–46.0)
Hemoglobin: 9.2 g/dL — ABNORMAL LOW (ref 12.0–15.0)
MCH: 27 pg (ref 26.0–34.0)
MCHC: 30.9 g/dL (ref 30.0–36.0)
MCV: 87.4 fL (ref 78.0–100.0)
MPV: 9 fL — ABNORMAL LOW (ref 9.4–12.4)
PLATELETS: 364 10*3/uL (ref 150–400)
RBC: 3.41 MIL/uL — AB (ref 3.87–5.11)
RDW: 19.8 % — ABNORMAL HIGH (ref 11.5–15.5)
WBC: 4.5 10*3/uL (ref 4.0–10.5)

## 2014-10-25 LAB — GLUCOSE 6 PHOSPHATE DEHYDROGENASE: G-6PDH: 16.3 U/g Hgb (ref 7.0–20.5)

## 2014-10-25 LAB — BASIC METABOLIC PANEL
BUN: 8 mg/dL (ref 6–23)
CO2: 21 mEq/L (ref 19–32)
Calcium: 7.6 mg/dL — ABNORMAL LOW (ref 8.4–10.5)
Chloride: 104 mEq/L (ref 96–112)
Creat: 0.34 mg/dL — ABNORMAL LOW (ref 0.50–1.10)
GLUCOSE: 121 mg/dL — AB (ref 70–99)
POTASSIUM: 3.7 meq/L (ref 3.5–5.3)
SODIUM: 139 meq/L (ref 135–145)

## 2014-10-26 LAB — URINALYSIS, MICROSCOPIC ONLY
CRYSTALS: NONE SEEN
Casts: NONE SEEN

## 2014-11-01 ENCOUNTER — Telehealth: Payer: Self-pay | Admitting: Family Medicine

## 2014-11-01 NOTE — Telephone Encounter (Signed)
Just call to check on her and see how she's doing. It sounds like she has discontinued treatment for her metastatic lung cancer. I did leave a message with her son and just let her know that I'm thinking about her and please call if she needs anything.

## 2014-12-25 ENCOUNTER — Ambulatory Visit: Payer: Self-pay | Admitting: Family Medicine

## 2015-01-23 DEATH — deceased
